# Patient Record
Sex: Male | Born: 1960
Health system: Southern US, Community
[De-identification: ages and names within clinical notes are randomized; demographics above are authoritative.]

## PROBLEM LIST (undated history)

## (undated) DIAGNOSIS — F32A Depression, unspecified: Secondary | ICD-10-CM

## (undated) DIAGNOSIS — I10 Essential (primary) hypertension: Secondary | ICD-10-CM

## (undated) HISTORY — PX: KNEE SURGERY: SHX244

---

## 1999-07-27 ENCOUNTER — Other Ambulatory Visit: Admission: RE | Admit: 1999-07-27 | Discharge: 1999-07-27 | Payer: Self-pay | Admitting: Urology

## 2014-09-15 ENCOUNTER — Ambulatory Visit: Payer: Self-pay | Admitting: *Deleted

## 2014-11-15 ENCOUNTER — Other Ambulatory Visit: Payer: Self-pay | Admitting: Nephrology

## 2014-11-15 DIAGNOSIS — N183 Chronic kidney disease, stage 3 unspecified: Secondary | ICD-10-CM

## 2014-11-16 ENCOUNTER — Ambulatory Visit
Admission: RE | Admit: 2014-11-16 | Discharge: 2014-11-16 | Disposition: A | Discharge status: Home / Self Care | Payer: Medicare HMO | Source: Ambulatory Visit | Attending: Nephrology | Admitting: Nephrology

## 2014-11-16 DIAGNOSIS — N183 Chronic kidney disease, stage 3 unspecified: Secondary | ICD-10-CM

## 2016-06-18 ENCOUNTER — Other Ambulatory Visit: Payer: Self-pay | Admitting: Family Medicine

## 2016-06-18 ENCOUNTER — Ambulatory Visit
Admission: RE | Admit: 2016-06-18 | Discharge: 2016-06-18 | Disposition: A | Discharge status: Home / Self Care | Payer: 59 | Source: Ambulatory Visit | Attending: Family Medicine | Admitting: Family Medicine

## 2016-06-18 DIAGNOSIS — M25461 Effusion, right knee: Secondary | ICD-10-CM

## 2016-06-18 DIAGNOSIS — M25561 Pain in right knee: Secondary | ICD-10-CM

## 2016-11-19 DIAGNOSIS — J029 Acute pharyngitis, unspecified: Secondary | ICD-10-CM | POA: Diagnosis not present

## 2016-11-19 DIAGNOSIS — R6889 Other general symptoms and signs: Secondary | ICD-10-CM | POA: Diagnosis not present

## 2018-03-17 ENCOUNTER — Encounter: Payer: Self-pay | Admitting: Family Medicine

## 2018-03-17 ENCOUNTER — Ambulatory Visit (INDEPENDENT_AMBULATORY_CARE_PROVIDER_SITE_OTHER): Payer: BLUE CROSS/BLUE SHIELD | Admitting: Family Medicine

## 2018-03-17 VITALS — BP 138/92 | Ht 71.0 in | Wt 211.0 lb

## 2018-03-17 DIAGNOSIS — M25562 Pain in left knee: Secondary | ICD-10-CM | POA: Diagnosis not present

## 2018-03-17 DIAGNOSIS — F339 Major depressive disorder, recurrent, unspecified: Secondary | ICD-10-CM

## 2018-03-17 DIAGNOSIS — F411 Generalized anxiety disorder: Secondary | ICD-10-CM | POA: Insufficient documentation

## 2018-03-17 DIAGNOSIS — S8992XA Unspecified injury of left lower leg, initial encounter: Secondary | ICD-10-CM

## 2018-03-17 MED ORDER — BUPROPION HCL ER (XL) 300 MG PO TB24: 300.0000 mg | ORAL_TABLET | Freq: Every day | ORAL | 11 refills | Status: DC

## 2018-03-17 NOTE — Progress Notes (Signed)
   Subjective:    Patient ID: Stephen [name], male    DOB: Aug 05, 1961, 57 y.o.   MRN: [id number]  HPI Patient is here today as a New patient. He is also here today with left knee pain.He states he injured it while unloading baggage he turned and it popped. Has not been too painful, not swelling,but feels something is moving around in there.  With turning and sudden popping sensation   Feels difficulty with extension   has some limited range of motion     Little achiness at night time  A sense of difficulty with extension   Things get kind of locked up   No prior difficulty with knee in the pst   Pos hx of right knee acl teear also substantial discussion  Held with patient about chronic depression with element of anxiety.  Has had this for many years.  States he definitely benefits from medication in an ongoing fashion.  He does have substantial challenges with anxiety and depression.  No suicidal thoughts.  Years ago he did have transient suicidal thoughts on going through divorce.  States overall the medication has done well form and would like to maintain   Review of Systems No headache, no major weight loss or weight gain, no chest pain no back pain abdominal pain no change in bowel habits complete ROS otherwise negative     Objective:   Physical Exam  Alert and oriented, vitals reviewed and stable, NAD ENT-TM's and ext canals WNL bilat via otoscopic exam Soft palate, tonsils and post pharynx WNL via oropharyngeal exam Neck-symmetric, no masses; thyroid nonpalpable and nontender Pulmonary-no tachypnea or accessory muscle use; Clear without wheezes via auscultation Card--no abnrml murmurs, rhythm reg and rate WNL Carotid pulses symmetric, without bruits Left knee negative joint line tenderness negative ACL laxity good range of motion currently.  Some diffuse pain posteriorly mild swelling posteriorly     Assessment & Plan:  Impression #1 left knee injury with  intermittent locking.  Discussed.  Likely represents cartilage injury.  Patient would prefer conservative approach at this time which I agree with.  Anti-inflammatory medicine plus time may have a mild cartilage injury which will resolve itself in terms of swelling and dysfunction.  Currently locking.  Locking persist several weeks from now return or call us and we will help set up with orthopedic  2.  Chronic depression/anxiety with ongoing benefit from meds discussed will maintain meds  3.  Encourage patient get on with wellness on into the summer  Old records

## 2018-03-23 ENCOUNTER — Other Ambulatory Visit: Payer: Self-pay

## 2018-04-01 DIAGNOSIS — L578 Other skin changes due to chronic exposure to nonionizing radiation: Secondary | ICD-10-CM | POA: Diagnosis not present

## 2018-04-01 DIAGNOSIS — L821 Other seborrheic keratosis: Secondary | ICD-10-CM | POA: Diagnosis not present

## 2018-04-01 DIAGNOSIS — L570 Actinic keratosis: Secondary | ICD-10-CM | POA: Diagnosis not present

## 2018-04-01 DIAGNOSIS — L820 Inflamed seborrheic keratosis: Secondary | ICD-10-CM | POA: Diagnosis not present

## 2018-07-22 DIAGNOSIS — G4733 Obstructive sleep apnea (adult) (pediatric): Secondary | ICD-10-CM | POA: Diagnosis not present

## 2018-08-10 ENCOUNTER — Ambulatory Visit (INDEPENDENT_AMBULATORY_CARE_PROVIDER_SITE_OTHER): Payer: BLUE CROSS/BLUE SHIELD | Admitting: Family Medicine

## 2018-08-10 ENCOUNTER — Encounter: Payer: Self-pay | Admitting: Family Medicine

## 2018-08-10 VITALS — BP 128/78 | Ht 71.0 in | Wt 215.0 lb

## 2018-08-10 DIAGNOSIS — F411 Generalized anxiety disorder: Secondary | ICD-10-CM

## 2018-08-10 DIAGNOSIS — Z0000 Encounter for general adult medical examination without abnormal findings: Secondary | ICD-10-CM

## 2018-08-10 DIAGNOSIS — Z131 Encounter for screening for diabetes mellitus: Secondary | ICD-10-CM

## 2018-08-10 DIAGNOSIS — Z13220 Encounter for screening for lipoid disorders: Secondary | ICD-10-CM

## 2018-08-10 DIAGNOSIS — F339 Major depressive disorder, recurrent, unspecified: Secondary | ICD-10-CM

## 2018-08-10 DIAGNOSIS — Z125 Encounter for screening for malignant neoplasm of prostate: Secondary | ICD-10-CM

## 2018-08-10 NOTE — Progress Notes (Signed)
   Subjective:    Patient ID: Stephen [name], male    DOB: 1961/08/01, 57 y.o.   MRN: [id number]  HPI  The patient comes in today for a wellness visit.    A review of their health history was completed.  A review of medications was also completed.  Any needed refills; none  Eating habits: trying to eat healthy  Falls/  MVA accidents in past few months: none  Regular exercise: do some  Specialist pt sees on regular basis: no  Preventative health issues were discussed.   Additional concerns: none  hx of knee challenges,  Overall left knee has calmed doewn    Gets irritable at times   Tolerating the well butrin well,  Feels does not need counselling at this time, working on self care issues  Uses levitra prn, not always   Pt takes tumeric        Review of Systems  Constitutional: Negative for activity change, appetite change and fever.  HENT: Negative for congestion and rhinorrhea.   Eyes: Negative for discharge.  Respiratory: Negative for cough and wheezing.   Cardiovascular: Negative for chest pain.  Gastrointestinal: Negative for abdominal pain, blood in stool and vomiting.  Genitourinary: Negative for difficulty urinating and frequency.  Musculoskeletal: Negative for neck pain.  Skin: Negative for rash.  Allergic/Immunologic: Negative for environmental allergies and food allergies.  Neurological: Negative for weakness and headaches.  Psychiatric/Behavioral: Negative for agitation.  All other systems reviewed and are negative.      Objective:   Physical Exam  Constitutional: He appears well-developed and well-nourished.  HENT:  Head: Normocephalic and atraumatic.  Right Ear: External ear normal.  Left Ear: External ear normal.  Nose: Nose normal.  Mouth/Throat: Oropharynx is clear and moist.  Eyes: Pupils are equal, round, and reactive to light. EOM are normal.  Neck: Normal range of motion. Neck supple. No thyromegaly present.    Cardiovascular: Normal rate, regular rhythm and normal heart sounds.  No murmur heard. Pulmonary/Chest: Effort normal and breath sounds normal. No respiratory distress. He has no wheezes.  Abdominal: Soft. Bowel sounds are normal. He exhibits no distension and no mass. There is no tenderness.  Genitourinary: Prostate normal and penis normal.  Musculoskeletal: Normal range of motion. He exhibits no edema.  Lymphadenopathy:    He has no cervical adenopathy.  Neurological: He is alert. He exhibits normal muscle tone.  Skin: Skin is warm and dry. No erythema.  Psychiatric: He has a normal mood and affect. His behavior is normal. Judgment normal.  Vitals reviewed.         Assessment & Plan:  Impression wellness exam.  Diet discussed.  Exercise discussed.  Vaccines discussed and flu shot due November 2020 due in fiv due to polyps , usually does not take flu, but go tone this yr, considering getting exercise equip, but not overdoing it ,  #2 generalized anxiety disorder with element of depression overall medication is controlling symptoms relatively well.  Discussed.  Patient thinks it definitely benefit from medicine so will maintain

## 2018-08-14 DIAGNOSIS — Z131 Encounter for screening for diabetes mellitus: Secondary | ICD-10-CM | POA: Diagnosis not present

## 2018-08-14 DIAGNOSIS — Z13220 Encounter for screening for lipoid disorders: Secondary | ICD-10-CM | POA: Diagnosis not present

## 2018-08-14 DIAGNOSIS — Z125 Encounter for screening for malignant neoplasm of prostate: Secondary | ICD-10-CM | POA: Diagnosis not present

## 2018-08-15 LAB — BASIC METABOLIC PANEL
BUN/Creatinine Ratio: 14 (ref 9–20)
BUN: 19 mg/dL (ref 6–24)
CO2: 20 mmol/L (ref 20–29)
Calcium: 9.6 mg/dL (ref 8.7–10.2)
Chloride: 105 mmol/L (ref 96–106)
Creatinine, Ser: 1.37 mg/dL — ABNORMAL HIGH (ref 0.76–1.27)
GFR calc Af Amer: 66 mL/min/{1.73_m2} (ref >59)
GFR calc non Af Amer: 57 mL/min/{1.73_m2} — ABNORMAL LOW (ref >59)
Glucose: 94 mg/dL (ref 65–99)
Potassium: 4.5 mmol/L (ref 3.5–5.2)
Sodium: 143 mmol/L (ref 134–144)

## 2018-08-15 LAB — LIPID PANEL
Chol/HDL Ratio: 4.4 ratio (ref 0.0–5.0)
Cholesterol, Total: 168 mg/dL (ref 100–199)
HDL: 38 mg/dL — ABNORMAL LOW (ref >39)
LDL Calculated: 85 mg/dL (ref 0–99)
Triglycerides: 224 mg/dL — ABNORMAL HIGH (ref 0–149)
VLDL Cholesterol Cal: 45 mg/dL — ABNORMAL HIGH (ref 5–40)

## 2018-08-15 LAB — HEPATIC FUNCTION PANEL
ALT: 34 IU/L (ref 0–44)
AST: 24 IU/L (ref 0–40)
Albumin: 4.6 g/dL (ref 3.5–5.5)
Alkaline Phosphatase: 72 IU/L (ref 39–117)
Bilirubin Total: <0.2 mg/dL (ref 0.0–1.2)
Bilirubin, Direct: 0.08 mg/dL (ref 0.00–0.40)
Total Protein: 7 g/dL (ref 6.0–8.5)

## 2018-08-15 LAB — PSA: Prostate Specific Ag, Serum: 0.8 ng/mL (ref 0.0–4.0)

## 2018-08-16 ENCOUNTER — Encounter: Payer: Self-pay | Admitting: Family Medicine

## 2019-01-19 ENCOUNTER — Other Ambulatory Visit: Payer: Self-pay | Admitting: Family Medicine

## 2019-02-12 ENCOUNTER — Ambulatory Visit: Payer: BLUE CROSS/BLUE SHIELD | Admitting: Family Medicine

## 2019-03-05 ENCOUNTER — Telehealth: Payer: Self-pay | Admitting: Family Medicine

## 2019-03-05 NOTE — Telephone Encounter (Signed)
Fax from [email or web address] requesting refill on Levitra 20 mg. Left message on patient voicemail to verify that he is requesting a refill though [facility].

## 2019-03-12 DIAGNOSIS — G4733 Obstructive sleep apnea (adult) (pediatric): Secondary | ICD-10-CM | POA: Diagnosis not present

## 2019-03-25 NOTE — Telephone Encounter (Signed)
Pt never returned call. Message closed.

## 2019-04-22 ENCOUNTER — Other Ambulatory Visit: Payer: Self-pay

## 2019-04-22 ENCOUNTER — Other Ambulatory Visit: Payer: Self-pay | Admitting: *Deleted

## 2019-04-22 ENCOUNTER — Ambulatory Visit (INDEPENDENT_AMBULATORY_CARE_PROVIDER_SITE_OTHER): Payer: BC Managed Care – PPO | Admitting: Family Medicine

## 2019-04-22 ENCOUNTER — Telehealth: Payer: Self-pay | Admitting: *Deleted

## 2019-04-22 DIAGNOSIS — S30861A Insect bite (nonvenomous) of abdominal wall, initial encounter: Secondary | ICD-10-CM

## 2019-04-22 DIAGNOSIS — R21 Rash and other nonspecific skin eruption: Secondary | ICD-10-CM | POA: Diagnosis not present

## 2019-04-22 DIAGNOSIS — W57XXXA Bitten or stung by nonvenomous insect and other nonvenomous arthropods, initial encounter: Secondary | ICD-10-CM

## 2019-04-22 DIAGNOSIS — R5383 Other fatigue: Secondary | ICD-10-CM | POA: Diagnosis not present

## 2019-04-22 MED ORDER — DOXYCYCLINE HYCLATE 100 MG PO TABS: 100.0000 mg | ORAL_TABLET | Freq: Two times a day (BID) | ORAL | 0 refills | Status: DC

## 2019-04-22 MED ORDER — VARDENAFIL HCL 20 MG PO TABS: 20.0000 mg | ORAL_TABLET | Freq: Every day | ORAL | 11 refills | Status: DC | PRN

## 2019-04-22 NOTE — Telephone Encounter (Signed)
Wellness on 08/10/18. Fax from [facility] requesting refill on levitra 20mg  #24.    Fax number [phone]

## 2019-04-22 NOTE — Progress Notes (Signed)
   Subjective:    Patient ID: Stephen [name], male    DOB: 11-26-1960, 58 y.o.   MRN: [id number]  HPI Good discussion with the patient He is having significant troubles with a rash around his wrist along with a slight headache and some fatigue denies high fever chills sweats he states he had a tick bite approximately a month ago he is worried about the possibility of Lyme's disease  Patient calls and states he had a headache yesterday. Patient also states he had an itchy rash on his wrist. Patient states the rash and headache is gone today but he was concerned because he had a tick bite 1 month ago  Virtual Visit via Video Note  I connected with Gregorey Nabor Farabaugh on 04/22/19 at  3:50 PM EDT by a video enabled telemedicine application and verified that I am speaking with the correct person using two identifiers.  Location: Patient: home Provider: office   I discussed the limitations of evaluation and management by telemedicine and the availability of in person appointments. The patient expressed understanding and agreed to proceed.  History of Present Illness:    Observations/Objective:   Assessment and Plan:   Follow Up Instructions:    I discussed the assessment and treatment plan with the patient. The patient was provided an opportunity to ask questions and all were answered. The patient agreed with the plan and demonstrated an understanding of the instructions.   The patient was advised to call back or seek an in-person evaluation if the symptoms worsen or if the condition fails to improve as anticipated.  I provided 15 minutes of non-face-to-face time during this encounter.      Review of Systems  Constitutional: Negative for activity change, appetite change and fatigue.  HENT: Negative for congestion and rhinorrhea.   Respiratory: Negative for cough and shortness of breath.   Cardiovascular: Negative for chest pain and leg swelling.  Gastrointestinal:  Negative for abdominal pain, nausea and vomiting.  Neurological: Negative for dizziness and headaches.  Psychiatric/Behavioral: Negative for agitation and behavioral problems.       Objective:   Physical Exam  Patient had virtual visit Appears to be in no distress Atraumatic Neuro able to relate and oriented No apparent resp distress Color normal         Assessment & Plan:  Significant rash with slight fatigue and slight headache no high fevers low likelihood of Lyme's disease we did discuss various options including lab work versus doxycycline versus watchful waiting After significant discussion patient chooses to do doxycycline twice daily for the next 2 weeks to follow-up if progressive troubles or if worse warning signs discussed in detail  I did talk with him should he start having body aches headache fever chills sweats or other symptoms worrisome for other illness or tick related illness today he should let us know right away and we will be happy to recheck

## 2019-04-22 NOTE — Telephone Encounter (Signed)
Script printed and at nurse station. Waiting for signature then will fax and call pt.

## 2019-04-22 NOTE — Telephone Encounter (Signed)
okrefill times 11

## 2019-04-23 NOTE — Telephone Encounter (Signed)
Script faxed and pt is aware.  

## 2019-05-14 ENCOUNTER — Other Ambulatory Visit: Payer: Self-pay

## 2019-05-14 ENCOUNTER — Other Ambulatory Visit: Payer: 59

## 2019-05-14 DIAGNOSIS — Z20822 Contact with and (suspected) exposure to covid-19: Secondary | ICD-10-CM

## 2019-05-14 DIAGNOSIS — R6889 Other general symptoms and signs: Secondary | ICD-10-CM | POA: Diagnosis not present

## 2019-05-19 ENCOUNTER — Other Ambulatory Visit: Payer: Self-pay

## 2019-05-19 ENCOUNTER — Other Ambulatory Visit: Payer: Self-pay | Admitting: Family Medicine

## 2019-05-19 MED ORDER — BUPROPION HCL ER (XL) 300 MG PO TB24: ORAL_TABLET | ORAL | 0 refills | Status: DC

## 2019-05-19 NOTE — Telephone Encounter (Signed)
Go ahead and give three mo worth on this chronicaLLY

## 2019-05-20 LAB — NOVEL CORONAVIRUS, NAA: SARS-CoV-2, NAA: NOT DETECTED

## 2019-06-03 DIAGNOSIS — L814 Other melanin hyperpigmentation: Secondary | ICD-10-CM | POA: Diagnosis not present

## 2019-06-03 DIAGNOSIS — L820 Inflamed seborrheic keratosis: Secondary | ICD-10-CM | POA: Diagnosis not present

## 2019-06-03 DIAGNOSIS — L821 Other seborrheic keratosis: Secondary | ICD-10-CM | POA: Diagnosis not present

## 2019-06-03 DIAGNOSIS — D229 Melanocytic nevi, unspecified: Secondary | ICD-10-CM | POA: Diagnosis not present

## 2019-08-31 ENCOUNTER — Ambulatory Visit (INDEPENDENT_AMBULATORY_CARE_PROVIDER_SITE_OTHER): Payer: BC Managed Care – PPO | Admitting: Family Medicine

## 2019-08-31 DIAGNOSIS — Z13220 Encounter for screening for lipoid disorders: Secondary | ICD-10-CM

## 2019-08-31 DIAGNOSIS — Z79899 Other long term (current) drug therapy: Secondary | ICD-10-CM

## 2019-08-31 DIAGNOSIS — Z125 Encounter for screening for malignant neoplasm of prostate: Secondary | ICD-10-CM | POA: Diagnosis not present

## 2019-08-31 DIAGNOSIS — M79642 Pain in left hand: Secondary | ICD-10-CM

## 2019-08-31 DIAGNOSIS — R5383 Other fatigue: Secondary | ICD-10-CM

## 2019-08-31 NOTE — Progress Notes (Signed)
   Subjective:  Audio plus video  Patient ID: Stephen [name], male    DOB: 06/21/1961, 58 y.o.   MRN: [id number]  Hand Pain  Associated symptoms comments: Swelling on left knuckle, middle finger. Sore in the mornings. Pt states this has been going on for about 6 months. No loss of feeling. Pt states once he gets it worked out, his hand is fine. . He has tried nothing for the symptoms.   Virtual Visit via Video Note  I connected with Stephen [name] on 08/31/19 at  9:00 AM EDT by a video enabled telemedicine application and verified that I am speaking with the correct person using two identifiers.  Location: Patient: home Provider: office   I discussed the limitations of evaluation and management by telemedicine and the availability of in person appointments. The patient expressed understanding and agreed to proceed.  History of Present Illness:    Observations/Objective:   Assessment and Plan:   Follow Up Instructions:    I discussed the assessment and treatment plan with the patient. The patient was provided an opportunity to ask questions and all were answered. The patient agreed with the plan and demonstrated an understanding of the instructions.   The patient was advised to call back or seek an in-person evaluation if the symptoms worsen or if the condition fails to improve as anticipated.  I provided 18 minutes of non-face-to-face time during this encounter.   Vicente Males, LPN  Patient notes significant family history of rheumatoid arthritis which understandably elevates his concern  His had his metacarpal phalangeal joint middle finger of his hand develop swelling pain tenderness and discomfort progressive over recent months.  Recalls no injury.  Uses as needed medication  Review of Systems No headache no chest pain no cough    Objective:   Physical Exam   Virtual when elevating hands the camera there is distinct swelling at the metacarpal phalangeal  junction    Assessment & Plan:  Impression and swelling focal with some discomfort and pain strong family history of rheumatoid arthritis.  Discussed.  We will press on with x-ray plus sedimentation rate and rheumatoid factor.  We will also add yearly screening blood work rationale discussed

## 2019-09-01 ENCOUNTER — Encounter: Payer: Self-pay | Admitting: Family Medicine

## 2019-09-02 DIAGNOSIS — Z13220 Encounter for screening for lipoid disorders: Secondary | ICD-10-CM | POA: Diagnosis not present

## 2019-09-02 DIAGNOSIS — Z125 Encounter for screening for malignant neoplasm of prostate: Secondary | ICD-10-CM | POA: Diagnosis not present

## 2019-09-02 DIAGNOSIS — M79642 Pain in left hand: Secondary | ICD-10-CM | POA: Diagnosis not present

## 2019-09-02 DIAGNOSIS — Z79899 Other long term (current) drug therapy: Secondary | ICD-10-CM | POA: Diagnosis not present

## 2019-09-03 LAB — BASIC METABOLIC PANEL
BUN/Creatinine Ratio: 9 (ref 9–20)
BUN: 14 mg/dL (ref 6–24)
CO2: 24 mmol/L (ref 20–29)
Calcium: 9.7 mg/dL (ref 8.7–10.2)
Chloride: 105 mmol/L (ref 96–106)
Creatinine, Ser: 1.5 mg/dL — ABNORMAL HIGH (ref 0.76–1.27)
GFR calc Af Amer: 58 mL/min/{1.73_m2} — ABNORMAL LOW (ref >59)
GFR calc non Af Amer: 51 mL/min/{1.73_m2} — ABNORMAL LOW (ref >59)
Glucose: 88 mg/dL (ref 65–99)
Potassium: 4.6 mmol/L (ref 3.5–5.2)
Sodium: 141 mmol/L (ref 134–144)

## 2019-09-03 LAB — LIPID PANEL
Chol/HDL Ratio: 3.5 ratio (ref 0.0–5.0)
Cholesterol, Total: 166 mg/dL (ref 100–199)
HDL: 47 mg/dL (ref >39)
LDL Chol Calc (NIH): 101 mg/dL — ABNORMAL HIGH (ref 0–99)
Triglycerides: 98 mg/dL (ref 0–149)
VLDL Cholesterol Cal: 18 mg/dL (ref 5–40)

## 2019-09-03 LAB — HEPATIC FUNCTION PANEL
ALT: 24 IU/L (ref 0–44)
AST: 20 IU/L (ref 0–40)
Albumin: 4.6 g/dL (ref 3.8–4.9)
Alkaline Phosphatase: 72 IU/L (ref 39–117)
Bilirubin Total: 0.4 mg/dL (ref 0.0–1.2)
Bilirubin, Direct: 0.14 mg/dL (ref 0.00–0.40)
Total Protein: 7.3 g/dL (ref 6.0–8.5)

## 2019-09-03 LAB — PSA: Prostate Specific Ag, Serum: 0.7 ng/mL (ref 0.0–4.0)

## 2019-09-03 LAB — RHEUMATOID FACTOR: Rheumatoid fact SerPl-aCnc: <10 IU/mL (ref 0.0–13.9)

## 2019-09-03 LAB — SEDIMENTATION RATE: Sed Rate: 8 mm/hr (ref 0–30)

## 2019-09-05 ENCOUNTER — Encounter: Payer: Self-pay | Admitting: Family Medicine

## 2019-09-08 ENCOUNTER — Ambulatory Visit (HOSPITAL_COMMUNITY)
Admission: RE | Admit: 2019-09-08 | Discharge: 2019-09-08 | Disposition: A | Discharge status: Home / Self Care | Payer: BC Managed Care – PPO | Source: Ambulatory Visit | Attending: Family Medicine | Admitting: Family Medicine

## 2019-09-08 ENCOUNTER — Other Ambulatory Visit: Payer: Self-pay

## 2019-09-08 DIAGNOSIS — M7989 Other specified soft tissue disorders: Secondary | ICD-10-CM | POA: Diagnosis not present

## 2019-09-08 DIAGNOSIS — M79642 Pain in left hand: Secondary | ICD-10-CM | POA: Diagnosis not present

## 2019-09-14 ENCOUNTER — Other Ambulatory Visit: Payer: Self-pay

## 2019-09-14 ENCOUNTER — Encounter: Payer: Self-pay | Admitting: Family Medicine

## 2019-09-14 ENCOUNTER — Ambulatory Visit (INDEPENDENT_AMBULATORY_CARE_PROVIDER_SITE_OTHER): Payer: BC Managed Care – PPO | Admitting: Family Medicine

## 2019-09-14 VITALS — BP 126/86 | Temp 97.2°F | Ht 71.0 in | Wt 210.6 lb

## 2019-09-14 DIAGNOSIS — Z23 Encounter for immunization: Secondary | ICD-10-CM

## 2019-09-14 DIAGNOSIS — N1831 Chronic kidney disease, stage 3a: Secondary | ICD-10-CM

## 2019-09-14 NOTE — Progress Notes (Signed)
Subjective:    Patient ID: Stephen [name], male    DOB: [birth year], 58 y.o.   MRN: [id number]  HPI  Patient arrives to discuss results of recent lab work.  Results for orders placed or performed in visit on 08/31/19  Sed Rate (ESR)  Result Value Ref Range   Sed Rate 8 0 - 30 mm/hr  Rheumatoid Factor  Result Value Ref Range   Rhuematoid fact SerPl-aCnc <10.0 0.0 - 13.9 IU/mL  Lipid Profile  Result Value Ref Range   Cholesterol, Total 166 100 - 199 mg/dL   Triglycerides 98 0 - 149 mg/dL   HDL 47 >39 mg/dL   VLDL Cholesterol Cal 18 5 - 40 mg/dL   LDL Chol Calc (NIH) 101 (H) 0 - 99 mg/dL   Chol/HDL Ratio 3.5 0.0 - 5.0 ratio  Hepatic function panel  Result Value Ref Range   Total Protein 7.3 6.0 - 8.5 g/dL   Albumin 4.6 3.8 - 4.9 g/dL   Bilirubin Total 0.4 0.0 - 1.2 mg/dL   Bilirubin, Direct 0.14 0.00 - 0.40 mg/dL   Alkaline Phosphatase 72 39 - 117 IU/L   AST 20 0 - 40 IU/L   ALT 24 0 - 44 IU/L  Basic Metabolic Panel (BMET)  Result Value Ref Range   Glucose 88 65 - 99 mg/dL   BUN 14 6 - 24 mg/dL   Creatinine, Ser 1.50 (H) 0.76 - 1.27 mg/dL   GFR calc non Af Amer 51 (L) >59 mL/min/1.73   GFR calc Af Amer 58 (L) >59 mL/min/1.73   BUN/Creatinine Ratio 9 9 - 20   Sodium 141 134 - 144 mmol/L   Potassium 4.6 3.5 - 5.2 mmol/L   Chloride 105 96 - 106 mmol/L   CO2 24 20 - 29 mmol/L   Calcium 9.7 8.7 - 10.2 mg/dL  PSA  Result Value Ref Range   Prostate Specific Ag, Serum 0.7 0.0 - 4.0 ng/mL   Transient acrid smell very couple days, hits temporarily, coes and goes  Off and on for 6 mo. Lasts min or two   Patient notes persistent and discomfort.  Primarily third metacarpal phalangeal joint.  Some swelling.  Patient was anxious due to family history of rheumatoid arthritis  Patient had blood work revealing creatinine 1.5 and GFR in the low 50s.  Review of old blood work from prior primary care doctor at [facility] showed that that GFR was similar for years ago.  Creatinine at  that time 1 threes.  Patient notes his creatinine has always been a bit elevated compared to normal.  There is some family history of renal failure right at the end of life with congestive heart failure.  Patient does not smoke.  No issues with blood pressure cholesterol or sugar.  Does not eat excessive amounts of  Review of Systems No headache, no major weight loss or weight gain, no chest pain no back pain abdominal pain no change in bowel habits complete ROS otherwise negative     Objective:   Physical Exam  Alert and oriented, vitals reviewed and stable, NAD ENT-TM's and ext canals WNL bilat via otoscopic exam Soft palate, tonsils and post pharynx WNL via oropharyngeal exam Neck-symmetric, no masses; thyroid nonpalpable and nontender Pulmonary-no tachypnea or accessory muscle use; Clear without wheezes via auscultation Card--no abnrml murmurs, rhythm reg and rate WNL Carotid pulses symmetric, without bruits Left hand no visible swelling of joints of concern.  There is otherwise normal  X-ray reviewed with  patient  Blood work results reviewed with patient      Assessment & Plan:  Impression 1 focal hand swelling.  Negative x-ray negative rheumatoid factor normal sed rate chance of rheumatological process very very low discussed with patient  2.  Chronic renal insufficiency.  Mild.  But an issue for the patient.  Long discussion held regarding long-term methods of avoiding progression to more serious kidney disease.  Many questions answered  Greater than 50% of this 25 minute face to face visit was spent in counseling and discussion and coordination of care regarding the above diagnosis/diagnosies  Flu shot today

## 2019-09-25 DIAGNOSIS — G4733 Obstructive sleep apnea (adult) (pediatric): Secondary | ICD-10-CM | POA: Diagnosis not present

## 2019-10-17 ENCOUNTER — Other Ambulatory Visit: Payer: Self-pay

## 2019-10-17 ENCOUNTER — Ambulatory Visit
Admission: EM | Admit: 2019-10-17 | Discharge: 2019-10-17 | Disposition: A | Discharge status: Home / Self Care | Payer: BC Managed Care – PPO | Attending: Emergency Medicine | Admitting: Emergency Medicine

## 2019-10-17 DIAGNOSIS — Z20828 Contact with and (suspected) exposure to other viral communicable diseases: Secondary | ICD-10-CM

## 2019-10-17 DIAGNOSIS — Z20822 Contact with and (suspected) exposure to covid-19: Secondary | ICD-10-CM

## 2019-10-17 DIAGNOSIS — J069 Acute upper respiratory infection, unspecified: Secondary | ICD-10-CM

## 2019-10-17 MED ORDER — CETIRIZINE HCL 10 MG PO TABS: 10.0000 mg | ORAL_TABLET | Freq: Every day | ORAL | 0 refills | Status: DC

## 2019-10-17 MED ORDER — FLUTICASONE PROPIONATE 50 MCG/ACT NA SUSP: 2.0000 | Freq: Every day | NASAL | 0 refills | Status: DC

## 2019-10-17 MED ORDER — BENZONATATE 100 MG PO CAPS: 100.0000 mg | ORAL_CAPSULE | Freq: Three times a day (TID) | ORAL | 0 refills | Status: DC

## 2019-10-17 NOTE — ED Triage Notes (Signed)
Pt presents to UC w/ co coughing, sore throat, mild headache since last night. Pt states he woke up coughing and persistent. Pt denies covid positive exposure.

## 2019-10-17 NOTE — Discharge Instructions (Signed)

## 2019-10-17 NOTE — ED Provider Notes (Signed)
Media   [id number] 10/17/19 Arrival Time: 0908   CC: COVID symptoms  SUBJECTIVE: History from: patient.  Stephen [name] is a 58 y.o. male who presents with mild productive cough, PND, and mild HA x 1 day.  Denies sick exposure to COVID, flu or strep.  Denies recent travel. States he used nasal spray prior to CPAP and speculates this may have contributed to his symptoms.   Has NOT tried OTC medications.  Symptoms are made worse with at night.  Reports previous symptoms in the past.   Denies fever, chills, fatigue, sinus pain, rhinorrhea, SOB, wheezing, chest pain, nausea, vomiting, changes in bowel or bladder habits.    ROS: As per HPI.  All other pertinent ROS negative.     History reviewed. No pertinent past medical history. History reviewed. No pertinent surgical history. No Known Allergies No current facility-administered medications on file prior to encounter.   Current Outpatient Medications on File Prior to Encounter  Medication Sig Dispense Refill  . buPROPion (WELLBUTRIN XL) 300 MG 24 hr tablet TAKE (1) TABLET BY MOUTH ONCE DAILY. 90 tablet 0  . Multiple Vitamin (MULTIVITAMIN) tablet Take 1 tablet by mouth daily.    Marland Kitchen OVER THE COUNTER MEDICATION Vit D once daily    . vardenafil (LEVITRA) 20 MG tablet Take 1 tablet (20 mg total) by mouth daily as needed for erectile dysfunction. 24 tablet 11   Social History   Socioeconomic History  . Marital status: Married    Spouse name: Not on file  . Number of children: Not on file  . Years of education: Not on file  . Highest education level: Not on file  Occupational History  . Not on file  Tobacco Use  . Smoking status: Never Smoker  . Smokeless tobacco: Never Used  Substance and Sexual Activity  . Alcohol use: Not on file  . Drug use: Not on file  . Sexual activity: Not on file  Other Topics Concern  . Not on file  Social History Narrative  . Not on file   Social Determinants of Health    Financial Resource Strain:   . Difficulty of Paying Living Expenses: Not on file  Food Insecurity:   . Worried About Charity fundraiser in the Last Year: Not on file  . Ran Out of Food in the Last Year: Not on file  Transportation Needs:   . Lack of Transportation (Medical): Not on file  . Lack of Transportation (Non-Medical): Not on file  Physical Activity:   . Days of Exercise per Week: Not on file  . Minutes of Exercise per Session: Not on file  Stress:   . Feeling of Stress : Not on file  Social Connections:   . Frequency of Communication with Friends and Family: Not on file  . Frequency of Social Gatherings with Friends and Family: Not on file  . Attends Religious Services: Not on file  . Active Member of Clubs or Organizations: Not on file  . Attends Archivist Meetings: Not on file  . Marital Status: Not on file  Intimate Partner Violence:   . Fear of Current or Ex-Partner: Not on file  . Emotionally Abused: Not on file  . Physically Abused: Not on file  . Sexually Abused: Not on file   Family History  Problem Relation Age of Onset  . Healthy Mother   . Healthy Father     OBJECTIVE:  Vitals:   10/17/19 0935  BP: Marland Kitchen)  143/82  Pulse: 79  Resp: 16  Temp: 98.5 F (36.9 C)  TempSrc: Oral  SpO2: 97%     General appearance: alert; well-appearing nontoxic; speaking in full sentences and tolerating own secretions HEENT: NCAT; Ears: EACs clear, TMs pearly gray; Eyes: PERRL.  EOM grossly intact.  Nose: nares patent without rhinorrhea, Throat: oropharynx clear, tonsils non erythematous or enlarged, uvula midline  Neck: supple without LAD Lungs: unlabored respirations, symmetrical air entry; cough: mild; no respiratory distress; CTAB Heart: regular rate and rhythm.  Skin: warm and dry Psychological: alert and cooperative; normal mood and affect  ASSESSMENT & PLAN:  1. Suspected COVID-19 virus infection   2. Viral URI with cough     Meds ordered this  encounter  Medications  . cetirizine (ZYRTEC) 10 MG tablet    Sig: Take 1 tablet (10 mg total) by mouth daily.    Dispense:  30 tablet    Refill:  0    Order Specific Question:   Supervising Provider    Answer:   Eustace Moore [[id number]]  . fluticasone (FLONASE) 50 MCG/ACT nasal spray    Sig: Place 2 sprays into both nostrils daily.    Dispense:  16 g    Refill:  0    Order Specific Question:   Supervising Provider    Answer:   Eustace Moore [[id number]]  . benzonatate (TESSALON) 100 MG capsule    Sig: Take 1 capsule (100 mg total) by mouth every 8 (eight) hours.    Dispense:  21 capsule    Refill:  0    Order Specific Question:   Supervising Provider    Answer:   Eustace Moore [[id number]]    COVID testing ordered.  It will take between 5-7 days for test results.  Someone will contact you regarding abnormal results.    In the meantime: You should remain isolated in your home for 10 days from symptom onset AND greater than 72 hours after symptoms resolution (absence of fever without the use of fever-reducing medication and improvement in respiratory symptoms), whichever is longer Get plenty of rest and push fluids Tessalon Perles prescribed for cough Use OTC zyrtec for nasal congestion, runny nose, and/or sore throat Use OTC flonase for nasal congestion and runny nose Use medications daily for symptom relief Use OTC medications like ibuprofen or tylenol as needed fever or pain Call or go to the ED if you have any new or worsening symptoms such as fever, worsening cough, shortness of breath, chest tightness, chest pain, turning blue, changes in mental status, etc...   Reviewed expectations re: course of current medical issues. Questions answered. Outlined signs and symptoms indicating need for more acute intervention. Patient verbalized understanding. After Visit Summary given.         Rennis Harding, PA-C 10/17/19 1005

## 2019-10-20 LAB — NOVEL CORONAVIRUS, NAA: SARS-CoV-2, NAA: NOT DETECTED

## 2019-10-25 ENCOUNTER — Other Ambulatory Visit: Payer: Self-pay | Admitting: Family Medicine

## 2019-11-03 ENCOUNTER — Other Ambulatory Visit: Payer: Self-pay

## 2019-11-03 ENCOUNTER — Ambulatory Visit (INDEPENDENT_AMBULATORY_CARE_PROVIDER_SITE_OTHER): Payer: BC Managed Care – PPO | Admitting: Family Medicine

## 2019-11-03 ENCOUNTER — Encounter: Payer: Self-pay | Admitting: Family Medicine

## 2019-11-03 DIAGNOSIS — Z20822 Contact with and (suspected) exposure to covid-19: Secondary | ICD-10-CM

## 2019-11-03 DIAGNOSIS — Z20828 Contact with and (suspected) exposure to other viral communicable diseases: Secondary | ICD-10-CM | POA: Diagnosis not present

## 2019-11-03 DIAGNOSIS — F339 Major depressive disorder, recurrent, unspecified: Secondary | ICD-10-CM

## 2019-11-03 NOTE — Telephone Encounter (Signed)
Stephen Kirschner, MD  You 3 minutes ago (9:07 AM)   Rec o v this eve, virt, let pt know I think he is misdiagnosing, we can rule in or rule out thyr disease with a blood tes but his constellation of symptoms are much more consistent with covid 19 than thyr (with a false neg in wife quite likely)

## 2019-11-03 NOTE — Progress Notes (Signed)
   Subjective:  Audio plus video  Patient ID: Stephen [name], male    DOB: [birth year], 59 y.o.   MRN: [id number]  HPI Pt was sick over the weekend. Saturday and Sunday pt throat was hurting, headache, body ached, lethargic feeling. Pt had some cough and runny nose on Saturday. 2 weeks ago went to Urgent Care and test was negative for COVID. Pt has been taking zinc, vitamin and multivitamins. Pt states his throat is hurting bad, but thyroid gland hurts worse. Pt states that thyroid gland is sore to the touch. Does not hurt that bad to swallow. Pt states his long distance vision is also blurry. Pt states he was having ear pain over the weekend also. Wife was sick over weekend also.   Virtual Visit via Video Note  I connected with Stephen [name] on 11/03/19 at  4:10 PM EST by a video enabled telemedicine application and verified that I am speaking with the correct person using two identifiers.  Location: Patient: home Provider: office    I discussed the limitations of evaluation and management by telemedicine and the availability of in person appointments. The patient expressed understanding and agreed to proceed.  History of Present Illness:    Observations/Objective:   Assessment and Plan:   Follow Up Instructions:    I discussed the assessment and treatment plan with the patient. The patient was provided an opportunity to ask questions and all were answered. The patient agreed with the plan and demonstrated an understanding of the instructions.   The patient was advised to call back or seek an in-person evaluation if the symptoms worsen or if the condition fails to improve as anticipated.  I provided 17 minutes of non-face-to-face time during this encounter.   Vicente Males, LPN   Review of Systems No shortness of breath no rash no high fevers    Objective:   Physical Exam Virtual Republican         Assessment & Plan:  Impression viral syndrome.  COVID-19  a substantial consideration.  Rationale discussed.  Testing recommended symptom care discussed

## 2019-11-03 NOTE — Telephone Encounter (Signed)
Patient scheduled virtual visit today with Dr Steve 

## 2019-11-04 ENCOUNTER — Other Ambulatory Visit: Payer: Self-pay

## 2019-11-04 ENCOUNTER — Ambulatory Visit: Payer: 59 | Attending: Internal Medicine

## 2019-11-04 DIAGNOSIS — Z20828 Contact with and (suspected) exposure to other viral communicable diseases: Secondary | ICD-10-CM | POA: Diagnosis not present

## 2019-11-04 DIAGNOSIS — Z20822 Contact with and (suspected) exposure to covid-19: Secondary | ICD-10-CM

## 2019-11-05 LAB — NOVEL CORONAVIRUS, NAA: SARS-CoV-2, NAA: NOT DETECTED

## 2019-12-08 ENCOUNTER — Encounter: Payer: Self-pay | Admitting: Family Medicine

## 2020-01-11 DIAGNOSIS — G4733 Obstructive sleep apnea (adult) (pediatric): Secondary | ICD-10-CM | POA: Diagnosis not present

## 2020-01-26 ENCOUNTER — Other Ambulatory Visit: Payer: Self-pay | Admitting: Family Medicine

## 2020-01-29 ENCOUNTER — Encounter: Payer: Self-pay | Admitting: Family Medicine

## 2020-02-21 ENCOUNTER — Encounter: Payer: Self-pay | Admitting: Family Medicine

## 2020-02-21 ENCOUNTER — Other Ambulatory Visit: Payer: Self-pay | Admitting: *Deleted

## 2020-02-22 NOTE — Progress Notes (Signed)
My chart message sent to patient to schedule visit.

## 2020-02-22 NOTE — Progress Notes (Signed)
I can see, but not next week and not today

## 2020-03-21 ENCOUNTER — Encounter: Payer: Self-pay | Admitting: Family Medicine

## 2020-03-23 ENCOUNTER — Encounter: Payer: Self-pay | Admitting: Family Medicine

## 2020-03-23 ENCOUNTER — Ambulatory Visit (INDEPENDENT_AMBULATORY_CARE_PROVIDER_SITE_OTHER): Payer: BC Managed Care – PPO | Admitting: Family Medicine

## 2020-03-23 ENCOUNTER — Other Ambulatory Visit: Payer: Self-pay

## 2020-03-23 VITALS — BP 130/86 | HR 82 | Temp 97.2°F | Wt 200.4 lb

## 2020-03-23 DIAGNOSIS — G8929 Other chronic pain: Secondary | ICD-10-CM

## 2020-03-23 DIAGNOSIS — M25511 Pain in right shoulder: Secondary | ICD-10-CM | POA: Diagnosis not present

## 2020-03-23 MED ORDER — DICLOFENAC SODIUM 1 % EX GEL: 2.0000 g | Freq: Four times a day (QID) | CUTANEOUS | 1 refills | Status: DC

## 2020-03-23 NOTE — Patient Instructions (Signed)
Shoulder Exercises Ask your health care provider which exercises are safe for you. Do exercises exactly as told by your health care provider and adjust them as directed. It is normal to feel mild stretching, pulling, tightness, or discomfort as you do these exercises. Stop right away if you feel sudden pain or your pain gets worse. Do not begin these exercises until told by your health care provider. Stretching exercises External rotation and abduction This exercise is sometimes called corner stretch. This exercise rotates your arm outward (external rotation) and moves your arm out from your body (abduction). 1. Stand in a doorway with one of your feet slightly in front of the other. This is called a staggered stance. If you cannot reach your forearms to the door frame, stand facing a corner of a room. 2. Choose one of the following positions as told by your health care provider: ? Place your hands and forearms on the door frame above your head. ? Place your hands and forearms on the door frame at the height of your head. ? Place your hands on the door frame at the height of your elbows. 3. Slowly move your weight onto your front foot until you feel a stretch across your chest and in the front of your shoulders. Keep your head and chest upright and keep your abdominal muscles tight. 4. Hold for __________ seconds. 5. To release the stretch, shift your weight to your back foot. Repeat __________ times. Complete this exercise __________ times a day. Extension, standing 1. Stand and hold a broomstick, a cane, or a similar object behind your back. ? Your hands should be a little wider than shoulder width apart. ? Your palms should face away from your back. 2. Keeping your elbows straight and your shoulder muscles relaxed, move the stick away from your body until you feel a stretch in your shoulders (extension). ? Avoid shrugging your shoulders while you move the stick. Keep your shoulder blades tucked  down toward the middle of your back. 3. Hold for __________ seconds. 4. Slowly return to the starting position. Repeat __________ times. Complete this exercise __________ times a day. Range-of-motion exercises Pendulum  1. Stand near a wall or a surface that you can hold onto for balance. 2. Bend at the waist and let your left / right arm hang straight down. Use your other arm to support you. Keep your back straight and do not lock your knees. 3. Relax your left / right arm and shoulder muscles, and move your hips and your trunk so your left / right arm swings freely. Your arm should swing because of the motion of your body, not because you are using your arm or shoulder muscles. 4. Keep moving your hips and trunk so your arm swings in the following directions, as told by your health care provider: ? Side to side. ? Forward and backward. ? In clockwise and counterclockwise circles. 5. Continue each motion for __________ seconds, or for as long as told by your health care provider. 6. Slowly return to the starting position. Repeat __________ times. Complete this exercise __________ times a day. Shoulder flexion, standing  1. Stand and hold a broomstick, a cane, or a similar object. Place your hands a little more than shoulder width apart on the object. Your left / right hand should be palm up, and your other hand should be palm down. 2. Keep your elbow straight and your shoulder muscles relaxed. Push the stick up with your healthy arm to   raise your left / right arm in front of your body, and then over your head until you feel a stretch in your shoulder (flexion). ? Avoid shrugging your shoulder while you raise your arm. Keep your shoulder blade tucked down toward the middle of your back. 3. Hold for __________ seconds. 4. Slowly return to the starting position. Repeat __________ times. Complete this exercise __________ times a day. Shoulder abduction, standing 1. Stand and hold a broomstick,  a cane, or a similar object. Place your hands a little more than shoulder width apart on the object. Your left / right hand should be palm up, and your other hand should be palm down. 2. Keep your elbow straight and your shoulder muscles relaxed. Push the object across your body toward your left / right side. Raise your left / right arm to the side of your body (abduction) until you feel a stretch in your shoulder. ? Do not raise your arm above shoulder height unless your health care provider tells you to do that. ? If directed, raise your arm over your head. ? Avoid shrugging your shoulder while you raise your arm. Keep your shoulder blade tucked down toward the middle of your back. 3. Hold for __________ seconds. 4. Slowly return to the starting position. Repeat __________ times. Complete this exercise __________ times a day. Internal rotation  1. Place your left / right hand behind your back, palm up. 2. Use your other hand to dangle an exercise band, a towel, or a similar object over your shoulder. Grasp the band with your left / right hand so you are holding on to both ends. 3. Gently pull up on the band until you feel a stretch in the front of your left / right shoulder. The movement of your arm toward the center of your body is called internal rotation. ? Avoid shrugging your shoulder while you raise your arm. Keep your shoulder blade tucked down toward the middle of your back. 4. Hold for __________ seconds. 5. Release the stretch by letting go of the band and lowering your hands. Repeat __________ times. Complete this exercise __________ times a day. Strengthening exercises External rotation  1. Sit in a stable chair without armrests. 2. Secure an exercise band to a stable object at elbow height on your left / right side. 3. Place a soft object, such as a folded towel or a small pillow, between your left / right upper arm and your body to move your elbow about 4 inches (10 cm) away  from your side. 4. Hold the end of the exercise band so it is tight and there is no slack. 5. Keeping your elbow pressed against the soft object, slowly move your forearm out, away from your abdomen (external rotation). Keep your body steady so only your forearm moves. 6. Hold for __________ seconds. 7. Slowly return to the starting position. Repeat __________ times. Complete this exercise __________ times a day. Shoulder abduction  1. Sit in a stable chair without armrests, or stand up. 2. Hold a __________ weight in your left / right hand, or hold an exercise band with both hands. 3. Start with your arms straight down and your left / right palm facing in, toward your body. 4. Slowly lift your left / right hand out to your side (abduction). Do not lift your hand above shoulder height unless your health care provider tells you that this is safe. ? Keep your arms straight. ? Avoid shrugging your shoulder while you   do this movement. Keep your shoulder blade tucked down toward the middle of your back. 5. Hold for __________ seconds. 6. Slowly lower your arm, and return to the starting position. Repeat __________ times. Complete this exercise __________ times a day. Shoulder extension 1. Sit in a stable chair without armrests, or stand up. 2. Secure an exercise band to a stable object in front of you so it is at shoulder height. 3. Hold one end of the exercise band in each hand. Your palms should face each other. 4. Straighten your elbows and lift your hands up to shoulder height. 5. Step back, away from the secured end of the exercise band, until the band is tight and there is no slack. 6. Squeeze your shoulder blades together as you pull your hands down to the sides of your thighs (extension). Stop when your hands are straight down by your sides. Do not let your hands go behind your body. 7. Hold for __________ seconds. 8. Slowly return to the starting position. Repeat __________ times.  Complete this exercise __________ times a day. Shoulder row 1. Sit in a stable chair without armrests, or stand up. 2. Secure an exercise band to a stable object in front of you so it is at waist height. 3. Hold one end of the exercise band in each hand. Position your palms so that your thumbs are facing the ceiling (neutral position). 4. Bend each of your elbows to a 90-degree angle (right angle) and keep your upper arms at your sides. 5. Step back until the band is tight and there is no slack. 6. Slowly pull your elbows back behind you. 7. Hold for __________ seconds. 8. Slowly return to the starting position. Repeat __________ times. Complete this exercise __________ times a day. Shoulder press-ups  1. Sit in a stable chair that has armrests. Sit upright, with your feet flat on the floor. 2. Put your hands on the armrests so your elbows are bent and your fingers are pointing forward. Your hands should be about even with the sides of your body. 3. Push down on the armrests and use your arms to lift yourself off the chair. Straighten your elbows and lift yourself up as much as you comfortably can. ? Move your shoulder blades down, and avoid letting your shoulders move up toward your ears. ? Keep your feet on the ground. As you get stronger, your feet should support less of your body weight as you lift yourself up. 4. Hold for __________ seconds. 5. Slowly lower yourself back into the chair. Repeat __________ times. Complete this exercise __________ times a day. Wall push-ups  1. Stand so you are facing a stable wall. Your feet should be about one arm-length away from the wall. 2. Lean forward and place your palms on the wall at shoulder height. 3. Keep your feet flat on the floor as you bend your elbows and lean forward toward the wall. 4. Hold for __________ seconds. 5. Straighten your elbows to push yourself back to the starting position. Repeat __________ times. Complete this exercise  __________ times a day. This information is not intended to replace advice given to you by your health care provider. Make sure you discuss any questions you have with your health care provider. Document Revised: 02/12/2019 Document Reviewed: 11/20/2018 Elsevier Patient Education  2020 [employer] Inc[employer]  

## 2020-03-23 NOTE — Progress Notes (Signed)
Patient ID: Stephen [name], male    DOB: 03-25-1961, 59 y.o.   MRN: [id number]   Chief Complaint  Patient presents with  . Shoulder Pain   Subjective:    Shoulder Pain  The pain is present in the right shoulder. Episode onset: Injured about 30 years ago with no treatment. The problem occurs intermittently. The problem has been gradually worsening. The pain is moderate. Associated symptoms include stiffness. Treatments tried: tylenol doesn't help and very little ibuprofen due to kidney issues.    Pt had shoulder injury on rt- with 2nd deg tear in 1991.  Never had treatment for it occ flares up with pain when doing exercise or hard labor.  Over weekend flared up when doing some flooring work.  Took tylenol and ibuprofen.  mild relief.  Hard to sleep on it or move it when hurting.  It has resolved today, but wanted to come in to discuss the on-going issue.  No numbness/tingling down the arm.   Pt also reporting intermittent knee pain on right, had acl repair many years ago.  Doesn't take anything for it other than tylenol.  Got flared up over weekend when doing flooring work on his knees.  Pain and swelling resolved now.    Medical History Stephen [name] has no past medical history on file.   Outpatient Encounter Medications as of 03/23/2020  Medication Sig  . benzonatate (TESSALON) 100 MG capsule Take 1 capsule (100 mg total) by mouth every 8 (eight) hours. (Patient not taking: Reported on 03/23/2020)  . buPROPion (WELLBUTRIN XL) 300 MG 24 hr tablet TAKE (1) TABLET BY MOUTH ONCE DAILY.  . cetirizine (ZYRTEC) 10 MG tablet Take 1 tablet (10 mg total) by mouth daily. (Patient not taking: Reported on 03/23/2020)  . diclofenac Sodium (VOLTAREN) 1 % GEL Apply 2 g topically 4 (four) times daily. Apply to shoulder for 2 wks.  . fluticasone (FLONASE) 50 MCG/ACT nasal spray Place 2 sprays into both nostrils daily. (Patient not taking: Reported on 03/23/2020)  . Multiple Vitamin (MULTIVITAMIN) tablet  Take 1 tablet by mouth daily.  Marland Kitchen OVER THE COUNTER MEDICATION Vit D once daily  . vardenafil (LEVITRA) 20 MG tablet Take 1 tablet (20 mg total) by mouth daily as needed for erectile dysfunction.   No facility-administered encounter medications on file as of 03/23/2020.     Review of Systems  Musculoskeletal: Positive for arthralgias (rt shoulder pain) and stiffness. Negative for back pain and joint swelling.     Vitals BP 130/86   Pulse 82   Temp (!) 97.2 F (36.2 C)   Wt 200 lb 6.4 oz (90.9 kg)   SpO2 98%   BMI 27.95 kg/m   Objective:   Physical Exam Vitals and nursing note reviewed.  Constitutional:      General: He is not in acute distress.    Appearance: Normal appearance. He is not ill-appearing.  Musculoskeletal:        General: No swelling or tenderness. Normal range of motion.     Comments: +neer's on right.  Negative Hawkins on right. No ttp on anterior shoulder.  Normal rom of shoulder-rt.  Skin:    General: Skin is warm and dry.     Findings: No rash.  Neurological:     General: No focal deficit present.     Mental Status: He is alert and oriented to person, place, and time.     Cranial Nerves: No cranial nerve deficit.      Assessment and  Plan   1. Chronic right shoulder pain - DG Shoulder Right; Future - Ambulatory referral to Physical Therapy    Offered ortho referral, prednisone taper, or pain medication for this pain.  At this time pt wanting to hold off and just try voltaren gel and tylenol.  Pt unable to take nsaids due to kidney disease.  Will order xrays. Pt not wanting to use opiates.  Pt not wanting to see ortho at this time for injection. If continuing to have pain and not improving, may need MRI and/or referral to ortho.  Will cont to monitor and call back if worsening.  F/u prn.

## 2020-03-27 ENCOUNTER — Telehealth: Payer: Self-pay | Admitting: Family Medicine

## 2020-03-27 ENCOUNTER — Other Ambulatory Visit: Payer: Self-pay | Admitting: *Deleted

## 2020-03-27 DIAGNOSIS — G8929 Other chronic pain: Secondary | ICD-10-CM

## 2020-03-27 NOTE — Telephone Encounter (Signed)
FYI --referral for occupational therapy for shoulder was ordered in epic.

## 2020-03-27 NOTE — Telephone Encounter (Signed)
ok 

## 2020-03-27 NOTE — Telephone Encounter (Signed)
Stephen [name] outpatient rehab called and said that the order for Physical therapy on his shoulder needs to be changed to occupational therapy instead.

## 2020-04-18 ENCOUNTER — Encounter: Payer: Self-pay | Admitting: Family Medicine

## 2020-04-19 ENCOUNTER — Encounter: Payer: Self-pay | Admitting: Nurse Practitioner

## 2020-04-19 ENCOUNTER — Ambulatory Visit (INDEPENDENT_AMBULATORY_CARE_PROVIDER_SITE_OTHER): Payer: BC Managed Care – PPO | Admitting: Nurse Practitioner

## 2020-04-19 ENCOUNTER — Other Ambulatory Visit: Payer: Self-pay

## 2020-04-19 VITALS — BP 138/76 | Temp 97.7°F | Ht 71.0 in | Wt 200.8 lb

## 2020-04-19 DIAGNOSIS — M7752 Other enthesopathy of left foot: Secondary | ICD-10-CM

## 2020-04-19 DIAGNOSIS — M65272 Calcific tendinitis, left ankle and foot: Secondary | ICD-10-CM | POA: Diagnosis not present

## 2020-04-19 NOTE — Patient Instructions (Signed)
[employer]'s Emergency Medicine: Concepts and Clinical Practice (9th ed., pp. 1392-1401). [place], [place]: [employer]. Retrieved from https://www.clinicalkey.com/#!/content/book/3-[email or web address]-[email or web address]?scrollTo=%[email or web address]">  Achilles Tendinitis  Achilles tendinitis is inflammation of the tough, cord-like band that attaches the lower leg muscles to the heel bone (Achilles tendon). This is usually caused by overusing the tendon and the ankle joint. Achilles tendinitis usually gets better over time with treatment and caring for yourself at home. It can take weeks or months to heal completely. What are the causes? This condition may be caused by:  A sudden increase in exercise or activity, such as running.  Doing the same exercises or activities, such as jumping, over and over.  Not warming up calf muscles before exercising.  Exercising in shoes that are worn out or not made for exercise.  Having arthritis or a bone growth (spur) on the back of the heel bone. This can rub against the tendon and hurt it.  Age-related wear and tear. Tendons become less flexible with age and are more likely to be injured. What are the signs or symptoms? Common symptoms of this condition include:  Pain in the Achilles tendon or in the back of the leg, just above the heel. The pain usually gets worse with exercise.  Stiffness or soreness in the back of the leg, especially in the morning.  Swelling of the skin over the Achilles tendon.  Thickening of the tendon.  Trouble standing on tiptoe. How is this diagnosed? This condition is diagnosed based on your symptoms and a physical exam. You may have tests, including:  X-rays.  MRI. How is this treated? The goal of treatment is to relieve symptoms and help your injury heal. Treatment may include:  Decreasing or stopping activities that caused the tendinitis. This may mean switching to low-impact exercises like biking or swimming.  Icing the injured  area.  Doing physical therapy, including strengthening and stretching exercises.  Taking NSAIDs, such as ibuprofen, to help relieve pain and swelling.  Using supportive shoes, wraps, heel lifts, or a walking boot (air cast).  Having surgery. This may be done if your symptoms do not improve after other treatments.  Using high-energy shock wave impulses to stimulate the healing process (extracorporeal shock wave therapy). This is rare.  Having an injection of medicines that help relieve inflammation (corticosteroids). This is rare. Follow these instructions at home: If you have an air cast:  Wear the air cast as told by your health care provider. Remove it only as told by your health care provider.  Loosen it if your toes tingle, become numb, or turn cold and blue.  Keep it clean.  If the air cast is not waterproof: ? Do not let it get wet. ? Cover it with a watertight covering when you take a bath or shower. Managing pain, stiffness, and swelling   If directed, put ice on the injured area. To do this: ? If you have a removable air cast, remove it as told by your health care provider. ? Put ice in a plastic bag. ? Place a towel between your skin and the bag. ? Leave the ice on for 20 minutes, 2-3 times a day.  Move your toes often to reduce stiffness and swelling.  Raise (elevate) your foot above the level of your heart while you are sitting or lying down. Activity  Gradually return to your normal activities as told by your health care provider. Ask your health care provider what activities are safe for you.  Do not do   activities that cause pain.  Consider doing low-impact exercises, like cycling or swimming.  Ask your health care provider when it is safe to drive if you have an air cast on your foot.  If physical therapy was prescribed, do exercises as told by your health care provider or physical therapist. General instructions  If directed, wrap your foot with an  elastic bandage or other wrap. This can help to keep your tendon from moving too much while it heals. Your health care provider will show you how to wrap your foot correctly.  Wear supportive shoes or heel lifts only as told by your health care provider.  Take over-the-counter and prescription medicines only as told by your health care provider.  Keep all follow-up visits as told by your health care provider. This is important. Contact a health care provider if you:  Have symptoms that get worse.  Have pain that does not get better with medicine.  Develop new, unexplained symptoms.  Develop warmth and swelling in your foot.  Have a fever. Get help right away if you:  Have a sudden popping sound or sensation in your Achilles tendon followed by severe pain.  Cannot move your toes or foot.  Cannot put any weight on your foot.  Your foot or toes become numb and look white or blue even after loosening your bandage or air cast. Summary  Achilles tendinitis is inflammation of the tough, cord-like band that attaches the lower leg muscles to the heel bone (Achilles tendon).  This condition is usually caused by overusing the tendon and the ankle joint. It can also be caused by arthritis or normal aging.  The most common symptoms of this condition include pain, swelling, or stiffness in the Achilles tendon or in the back of the leg.  This condition is usually treated by decreasing or stopping activities that caused the tendinitis, icing the injured area, taking NSAIDs, and doing physical therapy. This information is not intended to replace advice given to you by your health care provider. Make sure you discuss any questions you have with your health care provider. Document Revised: 03/08/2019 Document Reviewed: 03/08/2019 Elsevier Patient Education  2020 [employer] Inc.  

## 2020-04-19 NOTE — Progress Notes (Signed)
   Subjective:    Patient ID: Stephen [name], male    DOB: 09-Nov-1960, 59 y.o.   MRN: [id number]  HPI left heel pain for 3 months. Was walking for exercise and then started trying to run and then started having pain. States he cannot take ibuprofen due to kidney function.  About 3 months ago patient began walking/running trying to get back into shape.  Began having pain around the Achilles area in both legs.  Backed off the exercise used ice and rest, slowly begin to improve.  Occurs off and on.  A few weeks ago stepped off of a chair and felt an immediate severe pain in the left Achilles area.  Since then has had swelling and pain.  Can bear weight.  Difficulty walking at times.  Worse in the mornings.  Review of Systems     Objective:   Physical Exam NAD.  Alert, oriented.  Normal passive and active ROM of the left ankle.  Strong DP pulse.  Skin warm and dry.  Distinct tenderness noted along the left Achilles particularly towards the lower area where there is a localized area of mild edema, no erythema.  This is the area of maximal tenderness.       Assessment & Plan:  Left ankle tendinitis - Plan: DG Ankle Complete Left  Discussed diagnosis of Achilles tendinitis.  Explained that there may be at least a partial tear.  Patient defers Ortho consultation at this time.  Given written and verbal information on Achilles tendinitis.  Discussed ankle exercises.  Given a written prescription for an Aircast.  Patient cannot take oral NSAIDs, recommend he continue Voltaren gel as directed.  Avoid any strenuous exercises such as running or jumping for the next few weeks.  Patient to call back to the office in 2 to 3 weeks if no improvement, sooner if worse.  X-ray pending.

## 2020-04-21 ENCOUNTER — Ambulatory Visit (HOSPITAL_COMMUNITY)
Admission: RE | Admit: 2020-04-21 | Discharge: 2020-04-21 | Disposition: A | Discharge status: Home / Self Care | Payer: BC Managed Care – PPO | Source: Ambulatory Visit | Attending: Nurse Practitioner | Admitting: Nurse Practitioner

## 2020-04-21 ENCOUNTER — Encounter: Payer: Self-pay | Admitting: Nurse Practitioner

## 2020-04-21 ENCOUNTER — Encounter (HOSPITAL_COMMUNITY): Payer: Self-pay | Admitting: Occupational Therapy

## 2020-04-21 ENCOUNTER — Ambulatory Visit (HOSPITAL_COMMUNITY): Payer: BC Managed Care – PPO | Attending: Family Medicine | Admitting: Occupational Therapy

## 2020-04-21 ENCOUNTER — Other Ambulatory Visit: Payer: Self-pay

## 2020-04-21 DIAGNOSIS — M7752 Other enthesopathy of left foot: Secondary | ICD-10-CM | POA: Diagnosis not present

## 2020-04-21 DIAGNOSIS — G8929 Other chronic pain: Secondary | ICD-10-CM | POA: Insufficient documentation

## 2020-04-21 DIAGNOSIS — M7989 Other specified soft tissue disorders: Secondary | ICD-10-CM | POA: Diagnosis not present

## 2020-04-21 DIAGNOSIS — M25511 Pain in right shoulder: Secondary | ICD-10-CM | POA: Insufficient documentation

## 2020-04-21 DIAGNOSIS — R29898 Other symptoms and signs involving the musculoskeletal system: Secondary | ICD-10-CM | POA: Diagnosis not present

## 2020-04-21 DIAGNOSIS — M25572 Pain in left ankle and joints of left foot: Secondary | ICD-10-CM | POA: Diagnosis not present

## 2020-04-21 NOTE — Patient Instructions (Signed)
  1) Flexion Wall Stretch    Face wall, place affected handon wall in front of you. Slide hand up the wall  and lean body in towards the wall. Hold for 10 seconds. Repeat 3-5 times. 1-2 times/day.     2) Towel Stretch with Internal Rotation   Or     Gently pull up (or to the side) your affected arm  behind your back with the assist of a towel. Hold 10 seconds, repeat 3-5 times. 1-2 times/day.             3) Corner Stretch    Stand at a corner of a wall, place your arms on the walls with elbows bent. Lean into the corner until a stretch is felt along the front of your chest and/or shoulders. Hold for 10 seconds. Repeat 3-5X, 1-2 times/day.    4) Posterior Capsule Stretch    Bring the involved arm across chest. Grasp elbow and pull toward chest until you feel a stretch in the back of the upper arm and shoulder. Hold 10 seconds. Repeat 3-5X. Complete 1-2 times/day.     Strengthening Exercises: Use a 1 to 3lb weight (or a soup can or water bottle). Repeat all exercises 10-15 times, 1-2 times per day.  1) Shoulder Protraction    Begin with elbows by your side, slowly "punch" straight out in front of you.      2) Shoulder Flexion  Standing:         Begin with arms at your side with thumbs pointed up, slowly raise both arms up and forward towards overhead.               3) Horizontal abduction/adduction  Standing:           Begin with arms straight out in front of you, bring out to the side in at "T" shape. Keep arms straight entire time.                 4) Internal & External Rotation  Standing:   Stand with elbows at the side and elbows bent 90 degrees. Move your forearms away from your body, then bring back inward toward the body.     5) Shoulder Abduction  Standing:       Lying on your back begin with your arms flat on the table next to your side. Slowly move your arms out to the side so that they go overhead, in a  jumping jack or snow angel movement.

## 2020-04-21 NOTE — Therapy (Signed)
[facility] [address] [place], [place], [address] Phone: [phone]   Fax:  [phone]  Occupational Therapy Evaluation  Patient Details  Name: Stephen [name] MRN: [id number] Date of Birth: 08-Sep-1961 Referring Provider (OT): Dr. Laroy Apple   Encounter Date: 04/21/2020   OT End of Session - 04/21/20 1200    Visit Number 1    Number of Visits 1    Date for OT Re-Evaluation 04/24/20    Authorization Type [employer]    Authorization Time Period 30 visit limit    Authorization - Visit Number 1    Authorization - Number of Visits 30    OT Start Time 1117    OT Stop Time 1145    OT Time Calculation (min) 28 min    Activity Tolerance Patient tolerated treatment well    Behavior During Therapy [facility] for tasks assessed/performed           History reviewed. No pertinent past medical history.  History reviewed. No pertinent surgical history.  There were no vitals filed for this visit.   Subjective Assessment - 04/21/20 1133    Subjective  S: I think it's feeling back to it's normal state.    Pertinent History Pt is a 59 y/o male s/p grade II AC joint separation in 1991, who experienced significant weakness and pain after a lot of manual use recently lasting for approximately 10 days. Pt was referred to occupational therapy for evaluation and treatment by Dr. Laroy Apple.    Patient Stated Goals To be able to use my arm like I normally do.    Currently in Pain? No/denies             [facility] OT Assessment - 04/21/20 1117      Assessment   Medical Diagnosis Chronic right shoulder pain    Referring Provider (OT) Dr. Laroy Apple    Onset Date/Surgical Date --   chronic-1991   Hand Dominance Right    Next MD Visit None scheduled    Prior Therapy None      Precautions   Precautions None      Balance Screen   Has the patient fallen in the past 6 months No    Has the patient had a decrease in activity level because of a  fear of falling?  No    Is the patient reluctant to leave their home because of a fear of falling?  No      Prior Function   Level of Independence Independent    Vocation Full time employment    Vocation Requirements office work    Leisure riding motorcycle      ADL   ADL comments pt is having difficulty with reaching overhead and behind back. Pt has decreased strength in RUE since initial injury      Written Expression   Dominant Hand Right      Cognition   Overall Cognitive Status Within Functional Limits for tasks assessed      ROM / Strength   AROM / PROM / Strength AROM;Strength      AROM   Overall AROM Comments Assessed seated, er/IR adducted     AROM Assessment Site Shoulder    Right/Left Shoulder Right    Right Shoulder Flexion 160 Degrees    Right Shoulder ABduction 155 Degrees    Right Shoulder Internal Rotation 90 Degrees    Right Shoulder External Rotation 90 Degrees      Strength  Overall Strength Comments Assessed seated, er/IR adducted     Strength Assessment Site Shoulder    Right/Left Shoulder Right    Right Shoulder Flexion 5/5    Right Shoulder ABduction 5/5    Right Shoulder Internal Rotation 5/5    Right Shoulder External Rotation 5/5                           OT Education - 04/21/20 1133    Education Details shoulder stretches, shoulder strengthening/A/ROM    Person(s) Educated Patient    Methods Explanation;Demonstration;Handout    Comprehension Verbalized understanding;Returned demonstration            OT Short Term Goals - 04/21/20 1204      OT SHORT TERM GOAL #1   Title Pt will be provided with and educated on HEP to improve mobility required for daily tasks.    Time 1    Period Days    Status Achieved    Target Date 04/21/20                    Plan - 04/21/20 1201    Clinical Impression Statement A: Pt is a 59 y/o male presenting s/p grade II separation in 1991 with flare up recently after completing  manual labor. Increased weakness lasted approximately 10 days, and pt is reporting return to his baseline since that time. Pt with good ROM and strength. Pt provided with maintenance HEP including shoulder stretches and gentle strengthening.    OT Occupational Profile and History Problem Focused Assessment - Including review of records relating to presenting problem    Occupational performance deficits (Please refer to evaluation for details): ADL's    Body Structure / Function / Physical Skills Pain;ROM;Strength    Rehab Potential Good    Clinical Decision Making Limited treatment options, no task modification necessary    Comorbidities Affecting Occupational Performance: None    Modification or Assistance to Complete Evaluation  No modification of tasks or assist necessary to complete eval    OT Frequency One time visit    OT Treatment/Interventions Patient/family education    Plan P: Pt provided with HEP and educated on frequency and duration, as well as safe exercises to complete during work outs.    OT Home Exercise Plan 6/18: shoulder stretches, gentle strengthening    Consulted and Agree with Plan of Care Patient           Patient will benefit from skilled therapeutic intervention in order to improve the following deficits and impairments:   Body Structure / Function / Physical Skills: Pain, ROM, Strength       Visit Diagnosis: Chronic right shoulder pain  Other symptoms and signs involving the musculoskeletal system    Problem List Patient Active Problem List   Diagnosis Date Noted  . Generalized anxiety disorder 03/17/2018  . Depression, recurrent ([place]) 03/17/2018   Guadelupe Sabin, OTR/L  [phone] 04/21/2020, 12:05 PM  [facility] [address] [place], [place], [address] Phone: [phone]   Fax:  [phone]  Name: Stephen [name] MRN: [id number] Date of Birth: [birth year]

## 2020-04-25 ENCOUNTER — Telehealth: Payer: Self-pay | Admitting: Family Medicine

## 2020-04-25 NOTE — Telephone Encounter (Signed)
Scheduled 8/31

## 2020-04-25 NOTE — Telephone Encounter (Signed)
Patient is requesting refill on Bupropion 300 mg called into [facility] pharmacy last filled 01/26/20

## 2020-04-25 NOTE — Telephone Encounter (Signed)
Contact patient to have patient set up appt in 11-12 weeks. Thank you!

## 2020-04-25 NOTE — Telephone Encounter (Signed)
Needs appt in 11-12 wks. Thx. Dr. Ladona Ridgel

## 2020-04-25 NOTE — Telephone Encounter (Signed)
error 

## 2020-04-25 NOTE — Telephone Encounter (Signed)
Done. Thx.

## 2020-04-27 ENCOUNTER — Other Ambulatory Visit: Payer: Self-pay | Admitting: *Deleted

## 2020-04-27 ENCOUNTER — Telehealth: Payer: Self-pay | Admitting: *Deleted

## 2020-04-27 NOTE — Telephone Encounter (Signed)
Fax from [email or web address]. Request for refill on vadenafil 20mg  could not find this pharm in [place]. If you choose to refill. It will need to be printed and faxed.  Fax number [phone]  Pt last med check 09/14/19 and has upcoming appt on 06/06/20  Last labs 09/02/19 lipid, liver, bmp, psa, ra and sed rate

## 2020-04-28 MED ORDER — VARDENAFIL HCL 20 MG PO TABS: 20.0000 mg | ORAL_TABLET | Freq: Every day | ORAL | 11 refills | Status: DC | PRN

## 2020-04-28 NOTE — Telephone Encounter (Signed)
Prescription faxed to pharmacy as requested. 

## 2020-04-28 NOTE — Telephone Encounter (Signed)
Gave script to [facility], ready to fax. Thx. Dr. Karie Schwalbe

## 2020-07-04 ENCOUNTER — Ambulatory Visit: Payer: BC Managed Care – PPO | Admitting: Family Medicine

## 2020-07-24 ENCOUNTER — Other Ambulatory Visit: Payer: Self-pay | Admitting: Family Medicine

## 2020-08-08 ENCOUNTER — Encounter: Payer: Self-pay | Admitting: Family Medicine

## 2020-08-08 ENCOUNTER — Other Ambulatory Visit: Payer: Self-pay

## 2020-08-08 ENCOUNTER — Ambulatory Visit (INDEPENDENT_AMBULATORY_CARE_PROVIDER_SITE_OTHER): Payer: BC Managed Care – PPO | Admitting: Family Medicine

## 2020-08-08 VITALS — BP 136/88 | HR 83 | Temp 97.5°F | Wt 204.4 lb

## 2020-08-08 DIAGNOSIS — R030 Elevated blood-pressure reading, without diagnosis of hypertension: Secondary | ICD-10-CM | POA: Diagnosis not present

## 2020-08-08 DIAGNOSIS — F339 Major depressive disorder, recurrent, unspecified: Secondary | ICD-10-CM | POA: Diagnosis not present

## 2020-08-08 DIAGNOSIS — Z125 Encounter for screening for malignant neoplasm of prostate: Secondary | ICD-10-CM | POA: Diagnosis not present

## 2020-08-08 DIAGNOSIS — R7989 Other specified abnormal findings of blood chemistry: Secondary | ICD-10-CM | POA: Diagnosis not present

## 2020-08-08 DIAGNOSIS — F411 Generalized anxiety disorder: Secondary | ICD-10-CM

## 2020-08-08 MED ORDER — AMLODIPINE BESYLATE 2.5 MG PO TABS: 2.5000 mg | ORAL_TABLET | Freq: Every day | ORAL | 1 refills | Status: DC

## 2020-08-08 MED ORDER — BUPROPION HCL ER (XL) 300 MG PO TB24: 300.0000 mg | ORAL_TABLET | Freq: Every day | ORAL | 1 refills | Status: DC

## 2020-08-08 NOTE — Progress Notes (Signed)
Patient ID: Stephen [name], male    DOB: 08/25/1961, 59 y.o.   MRN: [id number]   Chief Complaint  Patient presents with  . Anxiety  . Depression   Subjective:    HPI  F/u on anxiety and depression-  Started around 2000.  Went through a divorce and started some medications.  Had grandfather that committed suicide. Has been on it for years. Taking it since 2019.  Tried zoloft and not best.  Then tried lexapro. Then went back to zoloft, then changed to wellbutrin. Helping with irritability. Having some perfectionist personality and helping with it. Has helped with anxiety. Has tried to get off it and then restarted due to wife thinking it was helping.   Noticing last few visits having slightly elevated BP.  No chest pain, sob, headache, or leg swelling.  Not on medications for this. Mother with h/o htn.  Kidney disease in his family history. Has had elevated Cr last few visits.  Medical History Stephen [name] has no past medical history on file.   Outpatient Encounter Medications as of 08/08/2020  Medication Sig  . amLODipine (NORVASC) 2.5 MG tablet Take 1 tablet (2.5 mg total) by mouth daily.  Marland Kitchen buPROPion (WELLBUTRIN XL) 300 MG 24 hr tablet Take 1 tablet (300 mg total) by mouth daily.  . diclofenac Sodium (VOLTAREN) 1 % GEL Apply 2 g topically 4 (four) times daily. Apply to shoulder for 2 wks. (Patient not taking: Reported on 08/08/2020)  . Multiple Vitamin (MULTIVITAMIN) tablet Take 1 tablet by mouth daily.  Marland Kitchen OVER THE COUNTER MEDICATION Vit D once daily  . vardenafil (LEVITRA) 20 MG tablet Take 1 tablet (20 mg total) by mouth daily as needed for erectile dysfunction.  . [DISCONTINUED] buPROPion (WELLBUTRIN XL) 300 MG 24 hr tablet TAKE (1) TABLET BY MOUTH ONCE DAILY.   No facility-administered encounter medications on file as of 08/08/2020.     Review of Systems  Constitutional: Negative for chills and fever.  HENT: Negative for congestion, rhinorrhea and sore throat.     Respiratory: Negative for cough, shortness of breath and wheezing.   Cardiovascular: Negative for chest pain and leg swelling.  Gastrointestinal: Negative for abdominal pain, diarrhea, nausea and vomiting.  Genitourinary: Negative for dysuria and frequency.  Skin: Negative for rash.  Neurological: Negative for dizziness, weakness and headaches.  Psychiatric/Behavioral: Positive for dysphoric mood. Negative for agitation, behavioral problems and sleep disturbance. The patient is nervous/anxious.        +controlled with meds.     Vitals BP 136/88   Pulse 83   Temp (!) 97.5 F (36.4 C)   Wt 204 lb 6.4 oz (92.7 kg)   SpO2 99%   BMI 28.51 kg/m   Objective:   Physical Exam Vitals and nursing note reviewed.  Constitutional:      General: He is not in acute distress.    Appearance: Normal appearance. He is not ill-appearing.  Cardiovascular:     Rate and Rhythm: Normal rate and regular rhythm.     Pulses: Normal pulses.     Heart sounds: Normal heart sounds.  Pulmonary:     Effort: Pulmonary effort is normal. No respiratory distress.     Breath sounds: Normal breath sounds.  Musculoskeletal:        General: Normal range of motion.  Skin:    General: Skin is warm and dry.     Findings: No rash.  Neurological:     General: No focal deficit present.  Mental Status: He is alert and oriented to person, place, and time.     Cranial Nerves: No cranial nerve deficit.  Psychiatric:        Mood and Affect: Mood normal.        Behavior: Behavior normal.        Thought Content: Thought content normal.        Judgment: Judgment normal.      Assessment and Plan   1. Depression, recurrent ([place])  2. Elevated blood pressure reading - CBC - CMP14+EGFR - Lipid panel  3. Screening PSA (prostate specific antigen) - PSA  4. Elevated serum creatinine  5. Generalized anxiety disorder   Htn/ elevated Cr- elevated bp last 2 visits.  Has elevated Cr at 1.5 in 2020.  Will  recheck labs.  advising to dec any salt intake. Sent in 2.47m norvasc, pt wanting to check bp 2x per day and call in with the numbers if seeing elevated still will pick up script.   Depression/anxiety- stable. Cont wellbutrin.  Pt to get labs this week.  F/u 636mor prn.

## 2020-08-23 DIAGNOSIS — Z125 Encounter for screening for malignant neoplasm of prostate: Secondary | ICD-10-CM | POA: Diagnosis not present

## 2020-08-23 DIAGNOSIS — R030 Elevated blood-pressure reading, without diagnosis of hypertension: Secondary | ICD-10-CM | POA: Diagnosis not present

## 2020-08-24 LAB — CMP14+EGFR
ALT: 18 IU/L (ref 0–44)
AST: 18 IU/L (ref 0–40)
Albumin/Globulin Ratio: 1.7 (ref 1.2–2.2)
Albumin: 4.3 g/dL (ref 3.8–4.9)
Alkaline Phosphatase: 70 IU/L (ref 44–121)
BUN/Creatinine Ratio: 11 (ref 9–20)
BUN: 14 mg/dL (ref 6–24)
Bilirubin Total: 0.3 mg/dL (ref 0.0–1.2)
CO2: 21 mmol/L (ref 20–29)
Calcium: 9.5 mg/dL (ref 8.7–10.2)
Chloride: 103 mmol/L (ref 96–106)
Creatinine, Ser: 1.32 mg/dL — ABNORMAL HIGH (ref 0.76–1.27)
GFR calc Af Amer: 68 mL/min/{1.73_m2} (ref >59)
GFR calc non Af Amer: 59 mL/min/{1.73_m2} — ABNORMAL LOW (ref >59)
Globulin, Total: 2.6 g/dL (ref 1.5–4.5)
Glucose: 89 mg/dL (ref 65–99)
Potassium: 4.3 mmol/L (ref 3.5–5.2)
Sodium: 141 mmol/L (ref 134–144)
Total Protein: 6.9 g/dL (ref 6.0–8.5)

## 2020-08-24 LAB — CBC
Hematocrit: 40.1 % (ref 37.5–51.0)
Hemoglobin: 14.1 g/dL (ref 13.0–17.7)
MCH: 33.7 pg — ABNORMAL HIGH (ref 26.6–33.0)
MCHC: 35.2 g/dL (ref 31.5–35.7)
MCV: 96 fL (ref 79–97)
Platelets: 226 10*3/uL (ref 150–450)
RBC: 4.19 x10E6/uL (ref 4.14–5.80)
RDW: 12.8 % (ref 11.6–15.4)
WBC: 6 10*3/uL (ref 3.4–10.8)

## 2020-08-24 LAB — LIPID PANEL
Chol/HDL Ratio: 2.6 ratio (ref 0.0–5.0)
Cholesterol, Total: 140 mg/dL (ref 100–199)
HDL: 54 mg/dL (ref >39)
LDL Chol Calc (NIH): 75 mg/dL (ref 0–99)
Triglycerides: 50 mg/dL (ref 0–149)
VLDL Cholesterol Cal: 11 mg/dL (ref 5–40)

## 2020-08-24 LAB — PSA: Prostate Specific Ag, Serum: 0.9 ng/mL (ref 0.0–4.0)

## 2020-08-25 ENCOUNTER — Encounter: Payer: Self-pay | Admitting: Family Medicine

## 2020-08-25 DIAGNOSIS — R7989 Other specified abnormal findings of blood chemistry: Secondary | ICD-10-CM | POA: Insufficient documentation

## 2020-09-15 MED ORDER — AMLODIPINE BESYLATE 5 MG PO TABS: 5.0000 mg | ORAL_TABLET | Freq: Every day | ORAL | 0 refills | Status: DC

## 2020-09-17 MED ORDER — AMLODIPINE BESYLATE 10 MG PO TABS: 10.0000 mg | ORAL_TABLET | Freq: Every day | ORAL | 1 refills | Status: DC

## 2020-09-17 NOTE — Addendum Note (Signed)
Addended by: Annalee Genta on: 09/17/2020 01:44 PM   Modules accepted: Orders

## 2020-09-18 ENCOUNTER — Other Ambulatory Visit: Payer: Self-pay | Admitting: *Deleted

## 2020-09-18 NOTE — Progress Notes (Signed)
Error

## 2020-11-08 DIAGNOSIS — G4733 Obstructive sleep apnea (adult) (pediatric): Secondary | ICD-10-CM | POA: Diagnosis not present

## 2020-11-30 DIAGNOSIS — G4733 Obstructive sleep apnea (adult) (pediatric): Secondary | ICD-10-CM | POA: Diagnosis not present

## 2020-12-14 DIAGNOSIS — D229 Melanocytic nevi, unspecified: Secondary | ICD-10-CM | POA: Diagnosis not present

## 2020-12-14 DIAGNOSIS — L853 Xerosis cutis: Secondary | ICD-10-CM | POA: Diagnosis not present

## 2020-12-14 DIAGNOSIS — L814 Other melanin hyperpigmentation: Secondary | ICD-10-CM | POA: Diagnosis not present

## 2020-12-14 DIAGNOSIS — L821 Other seborrheic keratosis: Secondary | ICD-10-CM | POA: Diagnosis not present

## 2020-12-22 ENCOUNTER — Other Ambulatory Visit: Payer: Self-pay | Admitting: Family Medicine

## 2021-01-22 ENCOUNTER — Telehealth: Payer: Self-pay | Admitting: Family Medicine

## 2021-01-22 NOTE — Telephone Encounter (Signed)
Needs visit in April. Please send back after appt is made

## 2021-01-22 NOTE — Telephone Encounter (Signed)
Sent my chart message to schedule appointment.

## 2021-01-23 MED ORDER — BUPROPION HCL ER (XL) 300 MG PO TB24: 300.0000 mg | ORAL_TABLET | Freq: Every day | ORAL | 0 refills | Status: DC

## 2021-01-23 NOTE — Telephone Encounter (Signed)
Please advise. Thank you

## 2021-01-23 NOTE — Telephone Encounter (Signed)
Patient schedule follow up for 4/6

## 2021-01-23 NOTE — Addendum Note (Signed)
Addended by: Annalee Genta on: 01/23/2021 02:28 PM   Modules accepted: Orders

## 2021-02-07 ENCOUNTER — Ambulatory Visit (INDEPENDENT_AMBULATORY_CARE_PROVIDER_SITE_OTHER): Payer: BC Managed Care – PPO | Admitting: Family Medicine

## 2021-02-07 ENCOUNTER — Other Ambulatory Visit: Payer: Self-pay

## 2021-02-07 VITALS — BP 138/82 | HR 69 | Temp 97.5°F | Ht 71.0 in

## 2021-02-07 DIAGNOSIS — F411 Generalized anxiety disorder: Secondary | ICD-10-CM

## 2021-02-07 DIAGNOSIS — I10 Essential (primary) hypertension: Secondary | ICD-10-CM

## 2021-02-07 DIAGNOSIS — R7989 Other specified abnormal findings of blood chemistry: Secondary | ICD-10-CM | POA: Diagnosis not present

## 2021-02-07 DIAGNOSIS — F339 Major depressive disorder, recurrent, unspecified: Secondary | ICD-10-CM | POA: Diagnosis not present

## 2021-02-07 MED ORDER — BUPROPION HCL ER (XL) 300 MG PO TB24: 300.0000 mg | ORAL_TABLET | Freq: Every day | ORAL | 1 refills | Status: DC

## 2021-02-07 MED ORDER — AMLODIPINE BESYLATE 10 MG PO TABS: ORAL_TABLET | ORAL | 1 refills | Status: DC

## 2021-02-07 NOTE — Progress Notes (Signed)
Patient ID: Stephen [name], male    DOB: 11/19/1960, 60 y.o.   MRN: [id number]   Chief Complaint  Patient presents with  . Hypertension    Follow up and refills  . Depression    Medication refill   Subjective:    HPI  HTN Pt compliant with BP meds.  No SEs Denies chest pain, sob, LE swelling, or blurry vision.   At home was 118/78.  bp running well. Taking norvasc.   Depression/anxiety- stable.  Doing well with wellbutrin.  Elevated cr in past, will recheck.   Medical History Stephen [name] has no past medical history on file.   Outpatient Encounter Medications as of 02/07/2021  Medication Sig  . Multiple Vitamin (MULTIVITAMIN) tablet Take 1 tablet by mouth daily.  Marland Kitchen OVER THE COUNTER MEDICATION Vit D once daily  . vardenafil (LEVITRA) 20 MG tablet Take 1 tablet (20 mg total) by mouth daily as needed for erectile dysfunction.  . [DISCONTINUED] amLODipine (NORVASC) 10 MG tablet TAKE (1) TABLET BY MOUTH ONCE DAILY.  . [DISCONTINUED] buPROPion (WELLBUTRIN XL) 300 MG 24 hr tablet Take 1 tablet (300 mg total) by mouth daily.  Melene Muller ON 03/04/2021] amLODipine (NORVASC) 10 MG tablet TAKE (1) TABLET BY MOUTH ONCE DAILY.  Melene Muller ON 03/04/2021] buPROPion (WELLBUTRIN XL) 300 MG 24 hr tablet Take 1 tablet (300 mg total) by mouth daily.  . [DISCONTINUED] diclofenac Sodium (VOLTAREN) 1 % GEL Apply 2 g topically 4 (four) times daily. Apply to shoulder for 2 wks. (Patient not taking: Reported on 08/08/2020)   No facility-administered encounter medications on file as of 02/07/2021.     Review of Systems  Constitutional: Negative for chills and fever.  HENT: Negative for congestion, rhinorrhea and sore throat.   Respiratory: Negative for cough, shortness of breath and wheezing.   Cardiovascular: Negative for chest pain and leg swelling.  Gastrointestinal: Negative for abdominal pain, diarrhea, nausea and vomiting.  Genitourinary: Negative for dysuria and frequency.  Skin: Negative for  rash.  Neurological: Negative for dizziness, weakness and headaches.     Vitals BP 138/82   Pulse 69   Temp (!) 97.5 F (36.4 C)   Ht 5\' 11"  (1.803 m)   SpO2 98%   BMI 28.51 kg/m   Objective:   Physical Exam Vitals and nursing note reviewed.  Constitutional:      General: He is not in acute distress.    Appearance: Normal appearance. He is not ill-appearing.  HENT:     Head: Normocephalic.     Nose: Nose normal. No congestion.     Mouth/Throat:     Mouth: Mucous membranes are moist.     Pharynx: No oropharyngeal exudate.  Eyes:     Extraocular Movements: Extraocular movements intact.     Conjunctiva/sclera: Conjunctivae normal.     Pupils: Pupils are equal, round, and reactive to light.  Cardiovascular:     Rate and Rhythm: Normal rate and regular rhythm.     Pulses: Normal pulses.     Heart sounds: Normal heart sounds. No murmur heard.   Pulmonary:     Effort: Pulmonary effort is normal.     Breath sounds: Normal breath sounds. No wheezing, rhonchi or rales.  Musculoskeletal:        General: Normal range of motion.     Right lower leg: No edema.     Left lower leg: No edema.  Skin:    General: Skin is warm and dry.  Findings: No rash.  Neurological:     General: No focal deficit present.     Mental Status: He is alert and oriented to person, place, and time.     Cranial Nerves: No cranial nerve deficit.  Psychiatric:        Mood and Affect: Mood normal.        Behavior: Behavior normal.        Thought Content: Thought content normal.        Judgment: Judgment normal.      Assessment and Plan   1. Hypertension, unspecified type - amLODipine (NORVASC) 10 MG tablet; TAKE (1) TABLET BY MOUTH ONCE DAILY.  Dispense: 90 tablet; Refill: 1  2. Elevated serum creatinine - Basic metabolic panel  3. Depression, recurrent ([facility]) - buPROPion (WELLBUTRIN XL) 300 MG 24 hr tablet; Take 1 tablet (300 mg total) by mouth daily.  Dispense: 90 tablet; Refill:  1  4. Generalized anxiety disorder - buPROPion (WELLBUTRIN XL) 300 MG 24 hr tablet; Take 1 tablet (300 mg total) by mouth daily.  Dispense: 90 tablet; Refill: 1    htn- suboptimal.  Cont to monitor.  Dec salt in diet.  Cont meds.  Elevated Cr- will recheck. Last visit was 1.32.  Depression/anxiety- stable. Cont meds.  Return in about 6 months (around 08/09/2021) for f/u htn, depression.   02/27/2021

## 2021-02-08 LAB — BASIC METABOLIC PANEL
BUN/Creatinine Ratio: 8 — ABNORMAL LOW (ref 9–20)
BUN: 11 mg/dL (ref 6–24)
CO2: 20 mmol/L (ref 20–29)
Calcium: 9.7 mg/dL (ref 8.7–10.2)
Chloride: 104 mmol/L (ref 96–106)
Creatinine, Ser: 1.31 mg/dL — ABNORMAL HIGH (ref 0.76–1.27)
Glucose: 84 mg/dL (ref 65–99)
Potassium: 5 mmol/L (ref 3.5–5.2)
Sodium: 142 mmol/L (ref 134–144)
eGFR: 63 mL/min/{1.73_m2} (ref >59)

## 2021-02-27 ENCOUNTER — Encounter: Payer: Self-pay | Admitting: Family Medicine

## 2021-03-19 NOTE — Telephone Encounter (Signed)
Nurses Please go ahead with referral to [facility] GI doctor [facility] Notify patient. GI office should reach out to the patient to have this scheduled

## 2021-03-20 ENCOUNTER — Other Ambulatory Visit: Payer: Self-pay | Admitting: *Deleted

## 2021-03-20 DIAGNOSIS — Z1211 Encounter for screening for malignant neoplasm of colon: Secondary | ICD-10-CM

## 2021-04-03 NOTE — Telephone Encounter (Signed)
Covid 19 recommendations-  It's symptomatic tx for viral illness. Take mucinex as directed, could change to delsym if needed.  Increase fluid intake.  Vit C, vit D and zinc are recommended vitamins to take.   zinc dose of 75 mg-100mg .  daily doses of vitamin D3 (1,000-4,000 IU), vitamin C (500 mg), and melatonin (0.3mg -2 mg each night).  Flonase for nasal congestion.  Use tylenol or ibuprofen as needed for fever or pain.   If not getting better with all this, then need to go to urgent care if after hours for further evaluation and check of vitals.   If needed we can call in the paxlovid medication, but will need a discussion of the side effects.   Take care,   Dr. Ladona Ridgel

## 2021-05-01 ENCOUNTER — Emergency Department (HOSPITAL_COMMUNITY): Payer: BC Managed Care – PPO

## 2021-05-01 ENCOUNTER — Emergency Department (HOSPITAL_COMMUNITY)
Admission: EM | Admit: 2021-05-01 | Discharge: 2021-05-01 | Disposition: A | Discharge status: Home / Self Care | Payer: BC Managed Care – PPO | Attending: Emergency Medicine | Admitting: Emergency Medicine

## 2021-05-01 ENCOUNTER — Other Ambulatory Visit: Payer: Self-pay

## 2021-05-01 ENCOUNTER — Encounter (HOSPITAL_COMMUNITY): Payer: Self-pay | Admitting: *Deleted

## 2021-05-01 DIAGNOSIS — I10 Essential (primary) hypertension: Secondary | ICD-10-CM | POA: Insufficient documentation

## 2021-05-01 DIAGNOSIS — Z8616 Personal history of COVID-19: Secondary | ICD-10-CM | POA: Insufficient documentation

## 2021-05-01 DIAGNOSIS — H538 Other visual disturbances: Secondary | ICD-10-CM | POA: Insufficient documentation

## 2021-05-01 DIAGNOSIS — Z79899 Other long term (current) drug therapy: Secondary | ICD-10-CM | POA: Insufficient documentation

## 2021-05-01 DIAGNOSIS — R531 Weakness: Secondary | ICD-10-CM

## 2021-05-01 DIAGNOSIS — M79606 Pain in leg, unspecified: Secondary | ICD-10-CM | POA: Diagnosis not present

## 2021-05-01 HISTORY — DX: Essential (primary) hypertension: I10

## 2021-05-01 HISTORY — DX: Depression, unspecified: F32.A

## 2021-05-01 LAB — COMPREHENSIVE METABOLIC PANEL
ALT: 22 U/L (ref 0–44)
AST: 20 U/L (ref 15–41)
Albumin: 4.2 g/dL (ref 3.5–5.0)
Alkaline Phosphatase: 63 U/L (ref 38–126)
Anion gap: 8 (ref 5–15)
BUN: 13 mg/dL (ref 6–20)
CO2: 24 mmol/L (ref 22–32)
Calcium: 8.9 mg/dL (ref 8.9–10.3)
Chloride: 108 mmol/L (ref 98–111)
Creatinine, Ser: 1.32 mg/dL — ABNORMAL HIGH (ref 0.61–1.24)
GFR, Estimated: >60 mL/min (ref >60)
Glucose, Bld: 99 mg/dL (ref 70–99)
Potassium: 3.9 mmol/L (ref 3.5–5.1)
Sodium: 140 mmol/L (ref 135–145)
Total Bilirubin: 0.5 mg/dL (ref 0.3–1.2)
Total Protein: 7.1 g/dL (ref 6.5–8.1)

## 2021-05-01 LAB — CBC WITH DIFFERENTIAL/PLATELET
Abs Immature Granulocytes: 0.02 10*3/uL (ref 0.00–0.07)
Basophils Absolute: 0 10*3/uL (ref 0.0–0.1)
Basophils Relative: 1 %
Eosinophils Absolute: 0.2 10*3/uL (ref 0.0–0.5)
Eosinophils Relative: 3 %
HCT: 43.3 % (ref 39.0–52.0)
Hemoglobin: 14.4 g/dL (ref 13.0–17.0)
Immature Granulocytes: 0 %
Lymphocytes Relative: 20 %
Lymphs Abs: 1.3 10*3/uL (ref 0.7–4.0)
MCH: 31.6 pg (ref 26.0–34.0)
MCHC: 33.3 g/dL (ref 30.0–36.0)
MCV: 95.2 fL (ref 80.0–100.0)
Monocytes Absolute: 0.4 10*3/uL (ref 0.1–1.0)
Monocytes Relative: 7 %
Neutro Abs: 4.4 10*3/uL (ref 1.7–7.7)
Neutrophils Relative %: 69 %
Platelets: 247 10*3/uL (ref 150–400)
RBC: 4.55 MIL/uL (ref 4.22–5.81)
RDW: 12.7 % (ref 11.5–15.5)
WBC: 6.3 10*3/uL (ref 4.0–10.5)
nRBC: 0 % (ref 0.0–0.2)

## 2021-05-01 MED ORDER — SODIUM CHLORIDE 0.9 % IV BOLUS
500.0000 mL | Freq: Once | INTRAVENOUS | Status: AC
  Administered 2021-05-01: 500 mL via INTRAVENOUS

## 2021-05-01 NOTE — ED Provider Notes (Signed)
[facility] EMERGENCY DEPARTMENT Provider Note   CSN: [id number] Arrival date & time: 05/01/21  1413     History No chief complaint on file.   Stephen [name] is a 60 y.o. male.  Patient states he had COVID a month ago.  He is complaining of blurred vision and weakness  The history is provided by the patient and medical records. No language interpreter was used.  Weakness Severity:  Mild Onset quality:  Sudden Timing:  Constant Progression:  Worsening Chronicity:  New Context: not alcohol use   Relieved by:  Nothing Worsened by:  Nothing Ineffective treatments:  None tried Associated symptoms: no abdominal pain, no chest pain, no cough, no diarrhea, no frequency, no headaches and no seizures       Past Medical History:  Diagnosis Date   Depression    Hypertension     Patient Active Problem List   Diagnosis Date Noted   Elevated serum creatinine 08/25/2020   Generalized anxiety disorder 03/17/2018   Depression, recurrent (H[facility]) 03/17/2018    Past Surgical History:  Procedure Laterality Date   KNEE SURGERY         Family History  Problem Relation Age of Onset   Healthy Mother    Healthy Father     Social History   Tobacco Use   Smoking status: Never   Smokeless tobacco: Never  Substance Use Topics   Alcohol use: Yes   Drug use: Never    Home Medications Prior to Admission medications   Medication Sig Start Date End Date Taking? Authorizing Provider  amLODipine (NORVASC) 10 MG tablet TAKE (1) TABLET BY MOUTH ONCE DAILY. 03/04/21   Ladona Ridgel, Malena M, DO  buPROPion (WELLBUTRIN XL) 300 MG 24 hr tablet Take 1 tablet (300 mg total) by mouth daily. 03/04/21   Annalee Genta, DO  Multiple Vitamin (MULTIVITAMIN) tablet Take 1 tablet by mouth daily.    [provider]  OVER THE COUNTER MEDICATION Vit D once daily    [provider]  vardenafil (LEVITRA) 20 MG tablet Take 1 tablet (20 mg total) by mouth daily as needed for erectile  dysfunction. 04/28/20   Annalee Genta, DO    Allergies    Patient has no known allergies.  Review of Systems   Review of Systems  Constitutional:  Negative for appetite change and fatigue.  HENT:  Negative for congestion, ear discharge and sinus pressure.        Blurred vision  Eyes:  Negative for discharge.  Respiratory:  Negative for cough.   Cardiovascular:  Negative for chest pain.  Gastrointestinal:  Negative for abdominal pain and diarrhea.  Genitourinary:  Negative for frequency and hematuria.  Musculoskeletal:  Negative for back pain.  Skin:  Negative for rash.  Neurological:  Positive for weakness. Negative for seizures and headaches.  Psychiatric/Behavioral:  Negative for hallucinations.    Physical Exam Updated Vital Signs BP 130/81   Pulse 64   Temp 98.3 F (36.8 C) (Oral)   Resp (!) 27   Ht 5\' 10"  (1.778 m)   Wt 93 kg   SpO2 99%   BMI 29.41 kg/m   Physical Exam Vitals and nursing note reviewed.  Constitutional:      Appearance: He is well-developed.  HENT:     Head: Normocephalic.  Eyes:     General: No scleral icterus.    Conjunctiva/sclera: Conjunctivae normal.  Neck:     Thyroid: No thyromegaly.  Cardiovascular:     Rate  and Rhythm: Normal rate and regular rhythm.     Heart sounds: No murmur heard.   No friction rub. No gallop.  Pulmonary:     Breath sounds: No stridor. No wheezing or rales.  Chest:     Chest wall: No tenderness.  Abdominal:     General: There is no distension.     Tenderness: There is no abdominal tenderness. There is no rebound.  Musculoskeletal:        General: Normal range of motion.     Cervical back: Neck supple.  Lymphadenopathy:     Cervical: No cervical adenopathy.  Skin:    Findings: No erythema or rash.  Neurological:     Mental Status: He is alert and oriented to person, place, and time.     Motor: No abnormal muscle tone.     Coordination: Coordination normal.  Psychiatric:        Behavior: Behavior  normal.    ED Results / Procedures / Treatments   Labs (all labs ordered are listed, but only abnormal results are displayed) Labs Reviewed  COMPREHENSIVE METABOLIC PANEL - Abnormal; Notable for the following components:      Result Value   Creatinine, Ser 1.32 (*)    All other components within normal limits  CBC WITH DIFFERENTIAL/PLATELET    EKG EKG Interpretation  Date/Time:  Tuesday May 01 2021 15:59:45 EDT Ventricular Rate:  72 PR Interval:  203 QRS Duration: 106 QT Interval:  396 QTC Calculation: 434 R Axis:   41 Text Interpretation: Sinus rhythm Borderline prolonged PR interval Low voltage, precordial leads Confirmed by Bethann Berkshire [phone]) on 05/01/2021 5:41:04 PM  Radiology CT Head Wo Contrast  Result Date: 05/01/2021 CLINICAL DATA:  Leg pain. Balance disturbance. Blurred vision. Recent coronavirus infection. EXAM: CT HEAD WITHOUT CONTRAST TECHNIQUE: Contiguous axial images were obtained from the base of the skull through the vertex without intravenous contrast. COMPARISON:  None. FINDINGS: Brain: The brain shows a normal appearance without evidence of malformation, atrophy, old or acute small or large vessel infarction, mass lesion, hemorrhage, hydrocephalus or extra-axial collection. Vascular: No hyperdense vessel. No evidence of atherosclerotic calcification. Skull: Normal.  No traumatic finding.  No focal bone lesion. Sinuses/Orbits: Sinuses are clear. Orbits appear normal. Mastoids are clear. Other: None significant IMPRESSION: Normal head CT. Electronically Signed   By: Paulina Fusi M.D.   On: 05/01/2021 15:54   DG Chest Port 1 View  Result Date: 05/01/2021 CLINICAL DATA:  Weakness. EXAM: PORTABLE CHEST 1 VIEW COMPARISON:  No recent prior. FINDINGS: Mediastinum and hilar structures normal. Heart size normal. No focal infiltrate. No pleural effusion or pneumothorax. Degenerative changes and scoliosis thoracic spine. IMPRESSION: No acute cardiopulmonary disease.  Electronically Signed   By: Maisie Fus  Register   On: 05/01/2021 15:42    Procedures Procedures   Medications Ordered in ED Medications  sodium chloride 0.9 % bolus 500 mL (500 mLs Intravenous New Bag/Given 05/01/21 1625)  sodium chloride 0.9 % bolus 500 mL (500 mLs Intravenous New Bag/Given 05/01/21 1619)    ED Course  I have reviewed the triage vital signs and the nursing notes.  Pertinent labs & imaging results that were available during my care of the patient were reviewed by me and considered in my medical decision making (see chart for details).    MDM Rules/Calculators/A&P                          Patient with blurred vision but  visual acuity was normal.  CT head was negative labs unremarkable.  Patient with COVID a month ago.  He will follow-up with his PCP Final Clinical Impression(s) / ED Diagnoses Final diagnoses:  None    Rx / DC Orders ED Discharge Orders     None        Bethann Berkshire, MD 05/04/21 1140

## 2021-05-01 NOTE — Discharge Instructions (Addendum)
Drink plenty of fluids and follow-up with your family doctor in 2 to 3 days for recheck.

## 2021-05-01 NOTE — ED Triage Notes (Signed)
States he ad covid last month, c/o leg pain , off balance, blurred vision and concerned his veins are larger than normal.

## 2021-05-01 NOTE — Telephone Encounter (Signed)
Pt scheduled for tomorrow morning 

## 2021-05-02 ENCOUNTER — Ambulatory Visit (INDEPENDENT_AMBULATORY_CARE_PROVIDER_SITE_OTHER): Payer: BC Managed Care – PPO | Admitting: Family Medicine

## 2021-05-02 ENCOUNTER — Encounter: Payer: Self-pay | Admitting: Family Medicine

## 2021-05-02 VITALS — BP 128/86 | HR 69 | Temp 98.2°F | Ht 70.0 in | Wt 206.0 lb

## 2021-05-02 DIAGNOSIS — F339 Major depressive disorder, recurrent, unspecified: Secondary | ICD-10-CM | POA: Diagnosis not present

## 2021-05-02 DIAGNOSIS — F411 Generalized anxiety disorder: Secondary | ICD-10-CM

## 2021-05-02 DIAGNOSIS — I10 Essential (primary) hypertension: Secondary | ICD-10-CM | POA: Diagnosis not present

## 2021-05-02 DIAGNOSIS — M79662 Pain in left lower leg: Secondary | ICD-10-CM

## 2021-05-02 DIAGNOSIS — M79661 Pain in right lower leg: Secondary | ICD-10-CM

## 2021-05-02 DIAGNOSIS — H538 Other visual disturbances: Secondary | ICD-10-CM

## 2021-05-02 MED ORDER — BUPROPION HCL ER (XL) 300 MG PO TB24: 300.0000 mg | ORAL_TABLET | Freq: Every day | ORAL | 1 refills | Status: DC

## 2021-05-02 MED ORDER — AMLODIPINE BESYLATE 10 MG PO TABS: ORAL_TABLET | ORAL | 1 refills | Status: DC

## 2021-05-02 NOTE — Progress Notes (Signed)
Patient ID: Stephen [name], male    DOB: 09/04/1961, 60 y.o.   MRN: [id number]   Chief Complaint  Patient presents with   Leg Pain    Vein concern    Subjective:    HPI Stands all day at work and was having bilateral leg pain. Pt states it felt like it was his veins and looked and it seemed like veins were sticking out more. Hurt to stand so laid down most of the day yesterday and kept legs elevated. Was getting worried and went to ED yesterday because vision got blurry, lasted 1-2 hrs.  Feeling better today.   Had covid 1 mo ago and lingering cough, and cough resolved.     After standing noticing veins in legs popping out.  Felt veins larger than normal in the lower legs.  Friend last fall had vein issues in the lower legs.  Yesterday at lunch started getting blurry, just had eye exam and new glasses.  Then got worried and went to ER. Wondered if having a stroke.  Evaluation was normal.  Slight elevated in Cr at 1.32.   Medical History Stephen [name] has a past medical history of Depression and Hypertension.   Outpatient Encounter Medications as of 05/02/2021  Medication Sig   Multiple Vitamin (MULTIVITAMIN) tablet Take 1 tablet by mouth daily.   OVER THE COUNTER MEDICATION Vit D once daily   vardenafil (LEVITRA) 20 MG tablet Take 1 tablet (20 mg total) by mouth daily as needed for erectile dysfunction.   [DISCONTINUED] amLODipine (NORVASC) 10 MG tablet TAKE (1) TABLET BY MOUTH ONCE DAILY.   [DISCONTINUED] buPROPion (WELLBUTRIN XL) 300 MG 24 hr tablet Take 1 tablet (300 mg total) by mouth daily.   buPROPion (WELLBUTRIN XL) 300 MG 24 hr tablet Take 1 tablet (300 mg total) by mouth daily.   [DISCONTINUED] amLODipine (NORVASC) 10 MG tablet TAKE (1) TABLET BY MOUTH ONCE DAILY.   No facility-administered encounter medications on file as of 05/02/2021.     Review of Systems  Constitutional:  Negative for chills and fever.  HENT:  Negative for congestion, rhinorrhea and sore  throat.   Eyes:  Positive for visual disturbance (resolved).  Respiratory:  Negative for cough, shortness of breath and wheezing.   Cardiovascular:  Negative for chest pain and leg swelling.  Gastrointestinal:  Negative for abdominal pain, diarrhea, nausea and vomiting.  Genitourinary:  Negative for dysuria and frequency.  Musculoskeletal:  Positive for arthralgias (lower leg pain/vein concern).  Skin:  Negative for rash.  Neurological:  Negative for dizziness, weakness and headaches.    Vitals BP 128/86   Pulse 69   Temp 98.2 F (36.8 C)   Ht 5\' 10"  (1.778 m)   Wt 206 lb (93.4 kg)   SpO2 99%   BMI 29.56 kg/m   Objective:   Physical Exam Vitals and nursing note reviewed.  Constitutional:      General: He is not in acute distress.    Appearance: Normal appearance. He is not ill-appearing.  HENT:     Head: Normocephalic.     Nose: Nose normal. No congestion.     Mouth/Throat:     Mouth: Mucous membranes are moist.     Pharynx: No oropharyngeal exudate.  Eyes:     Extraocular Movements: Extraocular movements intact.     Conjunctiva/sclera: Conjunctivae normal.     Pupils: Pupils are equal, round, and reactive to light.  Cardiovascular:     Rate and Rhythm: Normal rate and regular  rhythm.     Pulses: Normal pulses.     Heart sounds: Normal heart sounds. No murmur heard. Pulmonary:     Effort: Pulmonary effort is normal.     Breath sounds: Normal breath sounds. No wheezing, rhonchi or rales.  Musculoskeletal:        General: Normal range of motion.     Right lower leg: No edema.     Left lower leg: No edema.     Comments: Veins- not distended. Not painful.  No ecchymosis.  No pitting edema over lower leg or ankles. No varicose veins.  Skin:    General: Skin is warm and dry.     Findings: No rash.  Neurological:     General: No focal deficit present.     Mental Status: He is alert and oriented to person, place, and time.     Cranial Nerves: No cranial nerve  deficit.  Psychiatric:        Mood and Affect: Mood normal.        Behavior: Behavior normal.        Thought Content: Thought content normal.        Judgment: Judgment normal.     Assessment and Plan   1. Pain in both lower legs  2. Hypertension, unspecified type  3. Depression, recurrent (HCC) - buPROPion (WELLBUTRIN XL) 300 MG 24 hr tablet; Take 1 tablet (300 mg total) by mouth daily.  Dispense: 90 tablet; Refill: 1  4. Generalized anxiety disorder - buPROPion (WELLBUTRIN XL) 300 MG 24 hr tablet; Take 1 tablet (300 mg total) by mouth daily.  Dispense: 90 tablet; Refill: 1  5. Blurry vision   Blurry vision resolved. Pt was seen and evaluated in ER.  Stable and normal findings.  Call if having worsening symptoms.  Cva ruled out, cont to monitor.  Htn- stable. Cont meds. Pt on norvasc and may make lower legs with inc swelling at times.  Cont to monitor.  Depression/anxiety- stable. Cont meds.  Lower leg pain/swelling veins- cont to elevate legs, compression stockings and may need to change bp med if continues to bother him.  No sign of pitting edema.  Return in about 6 months (around 11/01/2021) for anxiety/depression, htn.   05/20/2021

## 2021-05-15 ENCOUNTER — Other Ambulatory Visit: Payer: Self-pay | Admitting: Family Medicine

## 2021-05-15 DIAGNOSIS — I10 Essential (primary) hypertension: Secondary | ICD-10-CM

## 2021-05-15 NOTE — Progress Notes (Signed)
Will hold off on norvasc 10mg , was seeing numbers 112/70's in mornings and feeling dizzy.  Will recheck off the medication for the next week and call with numbers.   Dr. 

## 2021-06-19 ENCOUNTER — Other Ambulatory Visit: Payer: Self-pay

## 2021-06-19 ENCOUNTER — Emergency Department (HOSPITAL_COMMUNITY): Payer: BC Managed Care – PPO

## 2021-06-19 ENCOUNTER — Observation Stay (HOSPITAL_COMMUNITY)
Admission: EM | Admit: 2021-06-19 | Discharge: 2021-06-20 | Disposition: A | Discharge status: Home / Self Care | Payer: BC Managed Care – PPO | Attending: Family Medicine | Admitting: Family Medicine

## 2021-06-19 ENCOUNTER — Encounter (HOSPITAL_COMMUNITY): Payer: Self-pay | Admitting: Emergency Medicine

## 2021-06-19 DIAGNOSIS — R42 Dizziness and giddiness: Secondary | ICD-10-CM

## 2021-06-19 DIAGNOSIS — Z79899 Other long term (current) drug therapy: Secondary | ICD-10-CM | POA: Diagnosis not present

## 2021-06-19 DIAGNOSIS — R200 Anesthesia of skin: Secondary | ICD-10-CM | POA: Diagnosis not present

## 2021-06-19 DIAGNOSIS — Z20822 Contact with and (suspected) exposure to covid-19: Secondary | ICD-10-CM | POA: Diagnosis not present

## 2021-06-19 DIAGNOSIS — G4733 Obstructive sleep apnea (adult) (pediatric): Secondary | ICD-10-CM | POA: Diagnosis not present

## 2021-06-19 DIAGNOSIS — I10 Essential (primary) hypertension: Secondary | ICD-10-CM | POA: Insufficient documentation

## 2021-06-19 DIAGNOSIS — Y900 Blood alcohol level of less than 20 mg/100 ml: Secondary | ICD-10-CM | POA: Diagnosis not present

## 2021-06-19 DIAGNOSIS — R531 Weakness: Secondary | ICD-10-CM | POA: Diagnosis not present

## 2021-06-19 DIAGNOSIS — R410 Disorientation, unspecified: Secondary | ICD-10-CM | POA: Diagnosis not present

## 2021-06-19 DIAGNOSIS — R202 Paresthesia of skin: Secondary | ICD-10-CM | POA: Diagnosis not present

## 2021-06-19 DIAGNOSIS — F411 Generalized anxiety disorder: Secondary | ICD-10-CM | POA: Diagnosis present

## 2021-06-19 DIAGNOSIS — G459 Transient cerebral ischemic attack, unspecified: Principal | ICD-10-CM | POA: Diagnosis present

## 2021-06-19 DIAGNOSIS — R55 Syncope and collapse: Secondary | ICD-10-CM | POA: Insufficient documentation

## 2021-06-19 LAB — I-STAT CHEM 8, ED
BUN: 11 mg/dL (ref 6–20)
Calcium, Ion: 1.18 mmol/L (ref 1.15–1.40)
Chloride: 104 mmol/L (ref 98–111)
Creatinine, Ser: 1.5 mg/dL — ABNORMAL HIGH (ref 0.61–1.24)
Glucose, Bld: 95 mg/dL (ref 70–99)
HCT: 42 % (ref 39.0–52.0)
Hemoglobin: 14.3 g/dL (ref 13.0–17.0)
Potassium: 4.2 mmol/L (ref 3.5–5.1)
Sodium: 141 mmol/L (ref 135–145)
TCO2: 28 mmol/L (ref 22–32)

## 2021-06-19 LAB — DIFFERENTIAL
Abs Immature Granulocytes: 0.02 10*3/uL (ref 0.00–0.07)
Basophils Absolute: 0 10*3/uL (ref 0.0–0.1)
Basophils Relative: 1 %
Eosinophils Absolute: 0.2 10*3/uL (ref 0.0–0.5)
Eosinophils Relative: 3 %
Immature Granulocytes: 0 %
Lymphocytes Relative: 23 %
Lymphs Abs: 1.5 10*3/uL (ref 0.7–4.0)
Monocytes Absolute: 0.4 10*3/uL (ref 0.1–1.0)
Monocytes Relative: 7 %
Neutro Abs: 4.3 10*3/uL (ref 1.7–7.7)
Neutrophils Relative %: 66 %

## 2021-06-19 LAB — URINALYSIS, ROUTINE W REFLEX MICROSCOPIC
Bacteria, UA: NONE SEEN
Bilirubin Urine: NEGATIVE
Glucose, UA: NEGATIVE mg/dL
Ketones, ur: NEGATIVE mg/dL
Leukocytes,Ua: NEGATIVE
Nitrite: NEGATIVE
Protein, ur: NEGATIVE mg/dL
Specific Gravity, Urine: 1.006 (ref 1.005–1.030)
pH: 7 (ref 5.0–8.0)

## 2021-06-19 LAB — CBG MONITORING, ED: Glucose-Capillary: 84 mg/dL (ref 70–99)

## 2021-06-19 LAB — RESP PANEL BY RT-PCR (FLU A&B, COVID) ARPGX2
Influenza A by PCR: NEGATIVE
Influenza B by PCR: NEGATIVE
SARS Coronavirus 2 by RT PCR: NEGATIVE

## 2021-06-19 LAB — COMPREHENSIVE METABOLIC PANEL
ALT: 23 U/L (ref 0–44)
AST: 20 U/L (ref 15–41)
Albumin: 4.5 g/dL (ref 3.5–5.0)
Alkaline Phosphatase: 67 U/L (ref 38–126)
Anion gap: 6 (ref 5–15)
BUN: 12 mg/dL (ref 6–20)
CO2: 26 mmol/L (ref 22–32)
Calcium: 9.1 mg/dL (ref 8.9–10.3)
Chloride: 106 mmol/L (ref 98–111)
Creatinine, Ser: 1.38 mg/dL — ABNORMAL HIGH (ref 0.61–1.24)
GFR, Estimated: 59 mL/min — ABNORMAL LOW (ref >60)
Glucose, Bld: 95 mg/dL (ref 70–99)
Potassium: 4 mmol/L (ref 3.5–5.1)
Sodium: 138 mmol/L (ref 135–145)
Total Bilirubin: 0.6 mg/dL (ref 0.3–1.2)
Total Protein: 7.8 g/dL (ref 6.5–8.1)

## 2021-06-19 LAB — TROPONIN I (HIGH SENSITIVITY)
Troponin I (High Sensitivity): <2 ng/L (ref <18)
Troponin I (High Sensitivity): 2 ng/L (ref <18)

## 2021-06-19 LAB — CBC
HCT: 43.5 % (ref 39.0–52.0)
Hemoglobin: 14.5 g/dL (ref 13.0–17.0)
MCH: 32.1 pg (ref 26.0–34.0)
MCHC: 33.3 g/dL (ref 30.0–36.0)
MCV: 96.2 fL (ref 80.0–100.0)
Platelets: 234 10*3/uL (ref 150–400)
RBC: 4.52 MIL/uL (ref 4.22–5.81)
RDW: 12.8 % (ref 11.5–15.5)
WBC: 6.5 10*3/uL (ref 4.0–10.5)
nRBC: 0 % (ref 0.0–0.2)

## 2021-06-19 LAB — HEMOGLOBIN A1C
Hgb A1c MFr Bld: 5.3 % (ref 4.8–5.6)
Mean Plasma Glucose: 105.41 mg/dL

## 2021-06-19 LAB — RAPID URINE DRUG SCREEN, HOSP PERFORMED
Amphetamines: NOT DETECTED
Barbiturates: NOT DETECTED
Benzodiazepines: NOT DETECTED
Cocaine: NOT DETECTED
Opiates: NOT DETECTED
Tetrahydrocannabinol: NOT DETECTED

## 2021-06-19 LAB — APTT: aPTT: 32 seconds (ref 24–36)

## 2021-06-19 LAB — TSH: TSH: 1.093 u[IU]/mL (ref 0.350–4.500)

## 2021-06-19 LAB — PROTIME-INR
INR: 1 (ref 0.8–1.2)
Prothrombin Time: 12.7 seconds (ref 11.4–15.2)

## 2021-06-19 LAB — ETHANOL: Alcohol, Ethyl (B): <10 mg/dL (ref <10)

## 2021-06-19 MED ORDER — STROKE: EARLY STAGES OF RECOVERY BOOK: Freq: Once | Status: AC

## 2021-06-19 MED ORDER — IBUPROFEN 400 MG PO TABS: 400.0000 mg | ORAL_TABLET | Freq: Four times a day (QID) | ORAL | Status: DC | PRN

## 2021-06-19 MED ORDER — ONDANSETRON HCL 4 MG/2ML IJ SOLN: 4.0000 mg | Freq: Four times a day (QID) | INTRAMUSCULAR | Status: DC | PRN

## 2021-06-19 MED ORDER — SODIUM CHLORIDE 0.9 % IV SOLN: 250.0000 mL | INTRAVENOUS | Status: DC | PRN

## 2021-06-19 MED ORDER — ASPIRIN EC 81 MG PO TBEC: 81.0000 mg | DELAYED_RELEASE_TABLET | Freq: Every day | ORAL | Status: DC

## 2021-06-19 MED ORDER — TRAZODONE HCL 50 MG PO TABS: 25.0000 mg | ORAL_TABLET | Freq: Every evening | ORAL | Status: DC | PRN

## 2021-06-19 MED ORDER — ASPIRIN 300 MG RE SUPP: 300.0000 mg | Freq: Every day | RECTAL | Status: DC

## 2021-06-19 MED ORDER — ASPIRIN 325 MG PO TABS
325.0000 mg | ORAL_TABLET | Freq: Every day | ORAL | Status: DC
  Administered 2021-06-19 – 2021-06-20 (×2): 325 mg via ORAL
  Filled 2021-06-19 (×2): qty 1

## 2021-06-19 MED ORDER — SODIUM CHLORIDE 0.9% FLUSH
3.0000 mL | Freq: Two times a day (BID) | INTRAVENOUS | Status: DC
  Administered 2021-06-20: 3 mL via INTRAVENOUS

## 2021-06-19 MED ORDER — VITAMIN D 25 MCG (1000 UNIT) PO TABS
1000.0000 [IU] | ORAL_TABLET | Freq: Every day | ORAL | Status: DC
  Administered 2021-06-19 – 2021-06-20 (×2): 1000 [IU] via ORAL
  Filled 2021-06-19 (×2): qty 1

## 2021-06-19 MED ORDER — BUPROPION HCL ER (XL) 300 MG PO TB24
300.0000 mg | ORAL_TABLET | Freq: Every day | ORAL | Status: DC
  Administered 2021-06-20: 300 mg via ORAL
  Filled 2021-06-19: qty 1

## 2021-06-19 MED ORDER — ONDANSETRON HCL 4 MG PO TABS: 4.0000 mg | ORAL_TABLET | Freq: Four times a day (QID) | ORAL | Status: DC | PRN

## 2021-06-19 MED ORDER — METOPROLOL TARTRATE 5 MG/5ML IV SOLN: 5.0000 mg | Freq: Four times a day (QID) | INTRAVENOUS | Status: DC | PRN

## 2021-06-19 MED ORDER — ENOXAPARIN SODIUM 40 MG/0.4ML IJ SOSY
40.0000 mg | PREFILLED_SYRINGE | INTRAMUSCULAR | Status: DC
  Administered 2021-06-19: 40 mg via SUBCUTANEOUS
  Filled 2021-06-19: qty 0.4

## 2021-06-19 MED ORDER — SODIUM CHLORIDE 0.9% FLUSH: 3.0000 mL | INTRAVENOUS | Status: DC | PRN

## 2021-06-19 MED ORDER — POLYETHYLENE GLYCOL 3350 17 G PO PACK: 17.0000 g | PACK | Freq: Every day | ORAL | Status: DC | PRN

## 2021-06-19 NOTE — H&P (Signed)
History and Physical    [name] QTM:[id number] DOB: 04/18/1961 DOA: 06/19/2021  PCP: Annalee Genta, DO  Patient coming from: Home   I have personally briefly reviewed patient's old medical records in [facility]  Chief Complaint: Dizziness  HPI: Stephen [name] is a 60 y.o. male with medical history significant of erectile dysfunction, anxiety and depression who was in his usual state of health until around 2 PM today.  He was talking on the phone and became lightheaded, dizzy, slightly confused.  Patient did take a Levitra this morning as well.  He reports some numbness and tingling in his left arm.  He has not really returned to his baseline and decided to come to the hospital.  He did take 4 aspirin at home. ED Course: In the ED his exam was noted to be nonfocal, imaging was negative.  Teleneurology recommended admission for TIA work-up.  Review of Systems: As per HPI otherwise 10 point review of systems negative.   Past Medical History:  Diagnosis Date   Depression    Hypertension     Past Surgical History:  Procedure Laterality Date   KNEE SURGERY       reports that he has never smoked. He has never used smokeless tobacco. He reports current alcohol use. He reports that he does not use drugs.  No Known Allergies  Family History  Problem Relation Age of Onset   Healthy Mother    Healthy Father     Prior to Admission medications   Medication Sig Start Date End Date Taking? Authorizing Provider  buPROPion (WELLBUTRIN XL) 300 MG 24 hr tablet Take 1 tablet (300 mg total) by mouth daily. 05/02/21  Yes Ladona Ridgel, Malena M, DO  Cholecalciferol 50 MCG (2000 UT) TABS Take 1 tablet by mouth daily.   Yes [provider]  Multiple Vitamin (MULTIVITAMIN) tablet Take 1 tablet by mouth daily.   Yes [provider]  oxymetazoline (AFRIN) 0.05 % nasal spray Place 2 sprays into both nostrils at bedtime.   Yes [provider]  vardenafil  (LEVITRA) 20 MG tablet Take 1 tablet (20 mg total) by mouth daily as needed for erectile dysfunction. Patient taking differently: Take 20 mg by mouth daily as needed for erectile dysfunction. Take 1/3 tablet daily as needed 04/28/20  Yes Taylor, Malena M, DO  buPROPion (WELLBUTRIN XL) 300 MG 24 hr tablet Take by mouth. Patient not taking: Reported on 06/19/2021 03/17/18   [provider]  OVER THE COUNTER MEDICATION Vit D once daily Patient not taking: Reported on 06/19/2021    [provider]    Physical Exam: Vitals:   06/19/21 1615 06/19/21 1630 06/19/21 1645 06/19/21 1700  BP: (!) 142/91 (!) 146/91 (!) 148/88 (!) 147/93  Pulse: 66 63 61 66  Resp: (!) 26 (!) 26 (!) 23 (!) 27  Temp:      TempSrc:      SpO2: 99% 100% 100% 100%  Weight:      Height:        Constitutional: NAD, calm, comfortable Vitals:   06/19/21 1615 06/19/21 1630 06/19/21 1645 06/19/21 1700  BP: (!) 142/91 (!) 146/91 (!) 148/88 (!) 147/93  Pulse: 66 63 61 66  Resp: (!) 26 (!) 26 (!) 23 (!) 27  Temp:      TempSrc:      SpO2: 99% 100% 100% 100%  Weight:      Height:       Eyes: PERRL, lids and  conjunctivae normal ENMT: Mucous membranes are moist. Posterior pharynx clear of any exudate or lesions.Normal dentition.  Neck: normal, supple, no masses, no thyromegaly Respiratory: clear to auscultation bilaterally, no wheezing, no crackles. Normal respiratory effort. No accessory muscle use.  Cardiovascular: Regular rate and rhythm, no murmurs / rubs / gallops. No extremity edema. 2+ pedal pulses. No carotid bruits.  Abdomen: no tenderness, no masses palpated. No hepatosplenomegaly. Bowel sounds positive.  Musculoskeletal: no clubbing / cyanosis. No joint deformity upper and lower extremities. Good ROM, no contractures. Normal muscle tone.  Skin: no rashes, lesions, ulcers. No induration Neurologic: CN 2-12 grossly intact. Sensation intact, DTR normal. Strength 5/5 in all 4.  Psychiatric: Normal  judgment and insight. Alert and oriented x 3. Normal mood.   Labs on Admission: I have personally reviewed following labs and imaging studies  CBC: Recent Labs  Lab 06/19/21 1517 06/19/21 1546  WBC 6.5  --   NEUTROABS 4.3  --   HGB 14.5 14.3  HCT 43.5 42.0  MCV 96.2  --   PLT 234  --    Basic Metabolic Panel: Recent Labs  Lab 06/19/21 1517 06/19/21 1546  NA 138 141  K 4.0 4.2  CL 106 104  CO2 26  --   GLUCOSE 95 95  BUN 12 11  CREATININE 1.38* 1.50*  CALCIUM 9.1  --    GFR: Estimated Creatinine Clearance: 60.8 mL/min (A) (by C-G formula based on SCr of 1.5 mg/dL (H)). Liver Function Tests: Recent Labs  Lab 06/19/21 1517  AST 20  ALT 23  ALKPHOS 67  BILITOT 0.6  PROT 7.8  ALBUMIN 4.5    Coagulation Profile: Recent Labs  Lab 06/19/21 1517  INR 1.0    CBG: Recent Labs  Lab 06/19/21 1508  GLUCAP 84     Radiological Exams on Admission: DG Chest Portable 1 View  Result Date: 06/19/2021 CLINICAL DATA:  Dizziness, weakness, confusion EXAM: PORTABLE CHEST 1 VIEW COMPARISON:  Chest radiograph 05/01/2021 FINDINGS: The cardiomediastinal silhouette is normal. The lungs are clear, with no focal consolidation or pulmonary edema. There is no pleural effusion or pneumothorax. There is no acute osseous abnormality. IMPRESSION: No radiographic evidence of acute cardiopulmonary process. Electronically Signed   By: Lesia Hausen M.D.   On: 06/19/2021 16:50   CT HEAD CODE STROKE WO CONTRAST  Result Date: 06/19/2021 CLINICAL DATA:  Code stroke. Transient ischemic attack. Dizziness and near syncopal episode, left-sided arm numbness. EXAM: CT HEAD WITHOUT CONTRAST TECHNIQUE: Contiguous axial images were obtained from the base of the skull through the vertex without intravenous contrast. COMPARISON:  Head CT 05/01/2021. FINDINGS: Brain: Cerebral volume is normal. There is no acute intracranial hemorrhage. No demarcated cortical infarct. No extra-axial fluid collection. No  evidence of an intracranial mass. No midline shift. Vascular: No hyperdense vessel.  Atherosclerotic calcifications. Skull: Normal. Negative for fracture or focal lesion. Sinuses/Orbits: Visualized orbits show no acute finding. No significant paranasal sinus disease at the imaged levels. ASPECTS (Alberta Stroke Program Early CT Score) - Ganglionic level infarction (caudate, lentiform nuclei, internal capsule, insula, M1-M3 cortex): 7 - Supraganglionic infarction (M4-M6 cortex): 3 Total score (0-10 with 10 being normal): 10 These results were called by telephone at the time of interpretation on 06/19/2021 at 3:36 pm to provider Dr. Aileen Pilot, who verbally acknowledged these results. IMPRESSION: No evidence of acute intracranial abnormality.  ASPECTS is 10. Electronically Signed   By: Jackey Loge D.O.   On: 06/19/2021 15:37    EKG: Independently reviewed.  Sinus rhythm, no ST-T wave changes.  Assessment/Plan Principal Problem:   TIA (transient ischemic attack) Active Problems:   Generalized anxiety disorder  TIA Admit to telemetry. Work-up to include A1c, lipid panel Echo  Generalized anxiety disorder Continue Wellbutrin  DVT prophylaxis: Lovenox SQ Code Status: Full code  Family Communication: Wife at bedside Disposition Plan: Home Consults called: Teleneurology by EDP Admission status: Observation patient is observation status due to ongoing work-up.   Reva Bores MD [facility]  If 7PM-7AM, please contact night-coverage 06/19/2021, 5:50 PM

## 2021-06-19 NOTE — ED Notes (Signed)
Stroke alert ended by neurologist

## 2021-06-19 NOTE — ED Triage Notes (Signed)
Pt c/o dizziness and near syncope while at work. Pt then noticed that his left arm was going numb. No hx of same.

## 2021-06-19 NOTE — ED Provider Notes (Signed)
I provided a substantive portion of the care of this patient.  I personally performed the entirety of the history, exam, and medical decision making for this encounter.   CRITICAL CARE Performed by: Vanetta Mulders Total critical care time: 45 minutes Critical care time was exclusive of separately billable procedures and treating other patients. Critical care was necessary to treat or prevent imminent or life-threatening deterioration. Critical care was time spent personally by me on the following activities: development of treatment plan with patient and/or surrogate as well as nursing, discussions with consultants, evaluation of patient's response to treatment, examination of patient, obtaining history from patient or surrogate, ordering and performing treatments and interventions, ordering and review of laboratory studies, ordering and review of radiographic studies, pulse oximetry and re-evaluation of patient's condition.     EKG Interpretation  Date/Time:  Tuesday June 19 2021 15:08:58 EDT Ventricular Rate:  71 PR Interval:  203 QRS Duration: 99 QT Interval:  380 QTC Calculation: 413 R Axis:   43 Text Interpretation: Sinus rhythm Borderline prolonged PR interval Low voltage, precordial leads Confirmed by Vanetta Mulders [phone]) on 06/19/2021 3:21:29 PM  Patient seen by me along with physician assistant.  Patient brought in by EMS.  Patient approximately 1 hour prior to arrival with symptoms that could be consistent with a TIA.  Patient had dizziness near syncope and noticed that left arm was going numb.  No prior history of anything similar.  All symptoms have resolved upon arrival here.  We did initiate code stroke for possibility of TIA.  In case symptoms came back and things got worse.  Head CT without any acute findings.  Evaluated by telemetry neurologist.  Recommending MRI and admission for TIA.  Patient's neuro exam remains normal.  No significant focal deficits at this time.   He is hypertensive with a blood pressure 171/103.  Labs without any significant abnormalities.     Vanetta Mulders, MD 06/19/21 [phone]

## 2021-06-19 NOTE — ED Provider Notes (Signed)
[facility] EMERGENCY DEPARTMENT Provider Note   CSN: [id number] Arrival date & time: 06/19/21  1450     History Chief Complaint  Patient presents with   Dizziness    Stephen [name] is a 60 y.o. male.   Dizziness Associated symptoms: no chest pain, no diarrhea, no headaches, no palpitations, no shortness of breath, no vomiting and no weakness       Stephen [name] is a 60 y.o. male who presents to the Emergency Department complaining of sudden onset dizziness and near syncope while at work.  Last known well at 2 PM today.  He states that he was talking on the telephone when he suddenly felt as though he was going to "pass out" he became dizzy and felt numbness and tingling of his left arm.  He also states that he felt slightly confused and took him longer to think about what he wanted to say.  No history of TIAs or strokes.  Symptoms lasted several minutes.  He took 4 baby aspirin's and called 911.  He denies any facial weakness, chest pain, nausea or vomiting.  No history of heart disease.  Had Covid 2 months ago.     Past Medical History:  Diagnosis Date   Depression    Hypertension     Patient Active Problem List   Diagnosis Date Noted   Elevated serum creatinine 08/25/2020   Generalized anxiety disorder 03/17/2018   Depression, recurrent (HCC) 03/17/2018    Past Surgical History:  Procedure Laterality Date   KNEE SURGERY         Family History  Problem Relation Age of Onset   Healthy Mother    Healthy Father     Social History   Tobacco Use   Smoking status: Never   Smokeless tobacco: Never  Substance Use Topics   Alcohol use: Yes   Drug use: Never    Home Medications Prior to Admission medications   Medication Sig Start Date End Date Taking? Authorizing Provider  buPROPion (WELLBUTRIN XL) 300 MG 24 hr tablet Take 1 tablet (300 mg total) by mouth daily. 05/02/21   Annalee Genta, DO  Multiple Vitamin (MULTIVITAMIN) tablet Take 1 tablet by  mouth daily.    [provider]  OVER THE COUNTER MEDICATION Vit D once daily    [provider]  vardenafil (LEVITRA) 20 MG tablet Take 1 tablet (20 mg total) by mouth daily as needed for erectile dysfunction. 04/28/20   Annalee Genta, DO    Allergies    Patient has no known allergies.  Review of Systems   Review of Systems  Constitutional:  Negative for appetite change and fever.  HENT:  Negative for trouble swallowing.   Eyes:  Negative for visual disturbance.  Respiratory:  Negative for shortness of breath.   Cardiovascular:  Negative for chest pain and palpitations.  Gastrointestinal:  Negative for abdominal pain, diarrhea and vomiting.  Genitourinary:  Negative for dysuria.  Musculoskeletal:  Negative for arthralgias.  Skin:  Negative for rash.  Neurological:  Positive for dizziness and numbness. Negative for seizures, syncope, speech difficulty, weakness and headaches.       Near syncope today.    Psychiatric/Behavioral:  Negative for confusion.    Physical Exam Updated Vital Signs BP (!) 171/103 (BP Location: Right Arm)   Pulse 71   Temp 97.9 F (36.6 C) (Oral)   Resp 16   Ht 5\' 10"  (1.778 m)   Wt 93 kg   SpO2  100%   BMI 29.41 kg/m   Physical Exam Vitals and nursing note reviewed.  Constitutional:      General: He is not in acute distress.    Appearance: Normal appearance. He is not ill-appearing.  HENT:     Head: Normocephalic.  Eyes:     Extraocular Movements: Extraocular movements intact.     Conjunctiva/sclera: Conjunctivae normal.     Pupils: Pupils are equal, round, and reactive to light.  Cardiovascular:     Rate and Rhythm: Normal rate and regular rhythm.     Pulses: Normal pulses.  Pulmonary:     Effort: Pulmonary effort is normal. No respiratory distress.  Chest:     Chest wall: No tenderness.  Abdominal:     General: There is no distension.     Palpations: Abdomen is soft.  Musculoskeletal:     Cervical back: Normal  range of motion. No tenderness.     Right lower leg: No edema.     Left lower leg: No edema.  Skin:    General: Skin is warm.     Capillary Refill: Capillary refill takes less than 2 seconds.     Findings: No rash.  Neurological:     General: No focal deficit present.     Mental Status: He is alert and oriented to person, place, and time.     GCS: GCS eye subscore is 4. GCS verbal subscore is 5. GCS motor subscore is 6.     Sensory: Sensation is intact. No sensory deficit.     Motor: Motor function is intact. No weakness, abnormal muscle tone or pronator drift.     Coordination: Coordination is intact. Heel to [facility] Test normal.     Comments: Cranial nerves II through XII intact.  Speech clear.  No pronator drift.  Normal finger-nose and heel shin testing.    ED Results / Procedures / Treatments   Labs (all labs ordered are listed, but only abnormal results are displayed) Labs Reviewed  COMPREHENSIVE METABOLIC PANEL - Abnormal; Notable for the following components:      Result Value   Creatinine, Ser 1.38 (*)    GFR, Estimated 59 (*)    All other components within normal limits  I-STAT CHEM 8, ED - Abnormal; Notable for the following components:   Creatinine, Ser 1.50 (*)    All other components within normal limits  RESP PANEL BY RT-PCR (FLU A&B, COVID) ARPGX2  ETHANOL  PROTIME-INR  APTT  CBC  DIFFERENTIAL  RAPID URINE DRUG SCREEN, HOSP PERFORMED  URINALYSIS, ROUTINE W REFLEX MICROSCOPIC  CBG MONITORING, ED  TROPONIN I (HIGH SENSITIVITY)  TROPONIN I (HIGH SENSITIVITY)    EKG EKG Interpretation  Date/Time:  Tuesday June 19 2021 15:08:58 EDT Ventricular Rate:  71 PR Interval:  203 QRS Duration: 99 QT Interval:  380 QTC Calculation: 413 R Axis:   43 Text Interpretation: Sinus rhythm Borderline prolonged PR interval Low voltage, precordial leads Confirmed by Vanetta Mulders [phone]) on 06/19/2021 3:21:29 PM  Radiology MR Brain Wo Contrast (neuro protocol)  Result  Date: 06/19/2021 CLINICAL DATA:  Transient ischemic attack (TIA). Additional history provided: Dizziness and near syncopal episode, left-sided arm numbness. EXAM: MRI HEAD WITHOUT CONTRAST TECHNIQUE: Multiplanar, multiecho pulse sequences of the brain and surrounding structures were obtained without intravenous contrast. COMPARISON:  Non-contrast head CT performed earlier today 06/19/2021. FINDINGS: Brain: Cerebral volume is normal for age. There are a few small scattered foci of T2/FLAIR hyperintensity within the cerebral white matter, nonspecific but  compatible with chronic small vessel ischemic disease. No cortical encephalomalacia is identified. There is no acute infarct. No evidence of an intracranial mass. No chronic intracranial blood products. No extra-axial fluid collection. No midline shift. Vascular: Maintained flow voids within the proximal large arterial vessels. Dominant left vertebral artery. Skull and upper cervical spine: No focal suspicious marrow lesion. Sinuses/Orbits: Visualized orbits show no acute finding. No significant paranasal sinus disease. IMPRESSION: No evidence of acute intracranial abnormality. Minimal chronic small-vessel ischemic changes within the cerebral white matter. Electronically Signed   By: Jackey Loge D.O.   On: 06/19/2021 18:04   DG Chest Portable 1 View  Result Date: 06/19/2021 CLINICAL DATA:  Dizziness, weakness, confusion EXAM: PORTABLE CHEST 1 VIEW COMPARISON:  Chest radiograph 05/01/2021 FINDINGS: The cardiomediastinal silhouette is normal. The lungs are clear, with no focal consolidation or pulmonary edema. There is no pleural effusion or pneumothorax. There is no acute osseous abnormality. IMPRESSION: No radiographic evidence of acute cardiopulmonary process. Electronically Signed   By: Lesia Hausen M.D.   On: 06/19/2021 16:50   CT HEAD CODE STROKE WO CONTRAST  Result Date: 06/19/2021 CLINICAL DATA:  Code stroke. Transient ischemic attack. Dizziness and  near syncopal episode, left-sided arm numbness. EXAM: CT HEAD WITHOUT CONTRAST TECHNIQUE: Contiguous axial images were obtained from the base of the skull through the vertex without intravenous contrast. COMPARISON:  Head CT 05/01/2021. FINDINGS: Brain: Cerebral volume is normal. There is no acute intracranial hemorrhage. No demarcated cortical infarct. No extra-axial fluid collection. No evidence of an intracranial mass. No midline shift. Vascular: No hyperdense vessel.  Atherosclerotic calcifications. Skull: Normal. Negative for fracture or focal lesion. Sinuses/Orbits: Visualized orbits show no acute finding. No significant paranasal sinus disease at the imaged levels. ASPECTS (Alberta Stroke Program Early CT Score) - Ganglionic level infarction (caudate, lentiform nuclei, internal capsule, insula, M1-M3 cortex): 7 - Supraganglionic infarction (M4-M6 cortex): 3 Total score (0-10 with 10 being normal): 10 These results were called by telephone at the time of interpretation on 06/19/2021 at 3:36 pm to provider Dr. Aileen Pilot, who verbally acknowledged these results. IMPRESSION: No evidence of acute intracranial abnormality.  ASPECTS is 10. Electronically Signed   By: Jackey Loge D.O.   On: 06/19/2021 15:37    Procedures Procedures   Medications Ordered in ED Medications - No data to display  ED Course  I have reviewed the triage vital signs and the nursing notes.  Pertinent labs & imaging results that were available during my care of the patient were reviewed by me and considered in my medical decision making (see chart for details).   CRITICAL CARE Performed by: Cortez Steelman Total critical care time: 40 minutes Critical care time was exclusive of separately billable procedures and treating other patients. Critical care was necessary to treat or prevent imminent or life-threatening deterioration. Critical care was time spent personally by me on the following activities: development of treatment  plan with patient and/or surrogate as well as nursing, discussions with consultants, evaluation of patient's response to treatment, examination of patient, obtaining history from patient or surrogate, ordering and performing treatments and interventions, ordering and review of laboratory studies, ordering and review of radiographic studies, pulse oximetry and re-evaluation of patient's condition.    MDM Rules/Calculators/A&P                           NIH stroke scale 0  Pain and here with sudden onset of dizzy, decreased concentration and near syncope.  Last known well 2 PM today.  No history of CVAs or TIAs.  Patient took four 81 mg aspirin prior to arrival.  1518  On my exam, patient well-appearing nontoxic.  Reassuring neurological testing.  Concern for possible TIA, Code stroke activated.  1535 CT head results negative  1545 [facility] with telemetry neuro who recommends no intervention at this time, admit to telemetry for TIA work-up and likely echo.  1735 discussed findings with [facility] hospitalist, Dr. Shawnie PonsPratt who agrees to admit.   Final Clinical Impression(s) / ED Diagnoses Final diagnoses:  TIA (transient ischemic attack)    Rx / DC Orders ED Discharge Orders     None        Pauline Ausriplett, Jaimin Krupka, PA-C 06/19/21 1814    Vanetta MuldersZackowski, Scott, MD 06/22/21 [phone]

## 2021-06-19 NOTE — Consult Note (Signed)
NEUROLOGY TELECONSULTATION NOTE   Date of service: June 19, 2021 Patient Name: Stephen [name] MRN:  [id number] DOB:  10/28/1961 Reason for consult: telestroke for vertigo and incoordination  Requesting Provider: Bethann Berkshire MD Consult Participants: myself, patient, bedside RN, telestroke RN Location of the provider: [facility] Location of the patient: [facility]  This consult was provided via telemedicine with 2-way video and audio communication. The patient/family was informed that care would be provided in this way and agreed to receive care in this manner.   _ _ _   _ __   _ __ _ _  __ __   _ __   __ _  History of Present Illness   This is a 60 year old gentleman with a history of depression and hypertension who presented to the emergency department after a near syncopal episode followed by dizziness and transient left arm tingling.  He also felt slightly confused and said that it took him longer to think about what he wanted to stay and expressive than usual.  LKW 1400, sx lasted a few min. He has no history of TIAs or strokes.  He is not on anticoagulation.  He was not on a daily antiplatelet but did take 325 mg of aspirin prior to calling 911.  He currently "feels a little bit off and and coordinated" but denies any focal neurologic deficits.  He has no history of TIA, stroke, or seizure.  He had COVID 2 months ago.  On examination his NIH stroke scale was 0 therefore he was not a candidate for tPA.  Head CT showed no acute intracranial abnormality.  ASPECTS 10.  CTA was not performed due to exam not being consistent with LVO.   ROS   Per HPI; all other systems reviewed and are negative  Past History   Past Medical History:  Diagnosis Date   Depression    Hypertension    Past Surgical History:  Procedure Laterality Date   KNEE SURGERY     Family History  Problem Relation Age of Onset   Healthy Mother    Healthy Father    Social History   Socioeconomic History    Marital status: Married    Spouse name: Not on file   Number of children: Not on file   Years of education: Not on file   Highest education level: Not on file  Occupational History   Not on file  Tobacco Use   Smoking status: Never   Smokeless tobacco: Never  Substance and Sexual Activity   Alcohol use: Yes   Drug use: Never   Sexual activity: Not on file  Other Topics Concern   Not on file  Social History Narrative   Not on file   Social Determinants of Health   Financial Resource Strain: Not on file  Food Insecurity: Not on file  Transportation Needs: Not on file  Physical Activity: Not on file  Stress: Not on file  Social Connections: Not on file   No Known Allergies  Medications   No current facility-administered medications for this encounter.  Current Outpatient Medications:    buPROPion (WELLBUTRIN XL) 300 MG 24 hr tablet, Take 1 tablet (300 mg total) by mouth daily., Disp: 90 tablet, Rfl: 1   Multiple Vitamin (MULTIVITAMIN) tablet, Take 1 tablet by mouth daily., Disp: , Rfl:    OVER THE COUNTER MEDICATION, Vit D once daily, Disp: , Rfl:    vardenafil (LEVITRA) 20 MG tablet, Take 1 tablet (20 mg total)  by mouth daily as needed for erectile dysfunction., Disp: 24 tablet, Rfl: 11     Vitals   Vitals:   06/19/21 1456 06/19/21 1530  BP: (!) 171/103 (!) 134/96  Pulse: 71 67  Resp: 16 (!) 30  Temp: 97.9 F (36.6 C)   TempSrc: Oral   SpO2: 100% 99%  Weight: 93 kg   Height: 5\' 10"  (1.778 m)      Body mass index is 29.41 kg/m.  Physical Exam   Exam performed over telemedicine with 2-way video and audio communication and with assistance of bedside RN  Physical Exam Gen: A&O x4, NAD Resp: normal WOB CV: extremities appear well-perfused  Neuro: *MS: A&O x4. Follows multi-step commands.  *Speech: nondysarthric, no aphasia, able to name and repeat *CN: PERRL 50mm, EOMI, VFF by confrontation, sensation intact, smile symmetric, hearing intact to  voice *Motor:   Normal bulk.  No tremor, rigidity or bradykinesia. No pronator drift. All extremities appear full-strength and symmetric. *Sensory: SILT. Symmetric. No double-simultaneous extinction.  *Coordination:  Finger-to-nose, heel-to-shin, rapid alternating motions were intact. *Reflexes:  UTA 2/2 tele-exam *Gait: deferred  NIHSS = 0 at 1520 on 06/19/21   Premorbid mRS = 0   Labs   CBC: No results for input(s): WBC, NEUTROABS, HGB, HCT, MCV, PLT in the last 168 hours.  Basic Metabolic Panel:  Lab Results  Component Value Date   NA 140 05/01/2021   K 3.9 05/01/2021   CO2 24 05/01/2021   GLUCOSE 99 05/01/2021   BUN 13 05/01/2021   CREATININE 1.32 (H) 05/01/2021   CALCIUM 8.9 05/01/2021   GFRNONAA >60 05/01/2021   GFRAA 68 08/23/2020   Lipid Panel:  Lab Results  Component Value Date   LDLCALC 75 08/23/2020   HgbA1c: No results found for: HGBA1C Urine Drug Screen: No results found for: LABOPIA, COCAINSCRNUR, LABBENZ, AMPHETMU, THCU, LABBARB  Alcohol Level No results found for: ETH   Impression   This is a 60 year old gentleman with a history of depression and hypertension who presented to the emergency department after a near syncopal episode followed by dizziness and transient left arm tingling.  tPA was not administered 2/2 NIHSS of 0. He still feels slightly "off and incoordinated" but he had no focal deficits on my exam. D/w ED and recommended obs admission for workup of possible TIA and near-syncope.  Recommendations   - Admit to hospitalist service - In-house neurology consult when available - Permissive HTN x48 hrs from sx onset or until stroke ruled out by MRI goal BP <220/110. PRN labetalol or hydralazine if BP above these parameters. Avoid oral antihypertensives. - MRI brain wo contrast - MRA H&N - TTE w/ bubble - Check A1c and LDL + add statin per guidelines - ASA 81mg  daily. This would be considered a low risk TIA based on ABCD2 score = 0 therefore  DAPT not indicated - q4 hr neuro checks - STAT head CT for any change in neuro exam - Tele - PT/OT/SLP - Stroke education - Amb referral to neurology upon discharge    ______________________________________________________________________   Thank you for the opportunity to take part in the care of this patient. If you have any further questions, please contact the neurology consultation attending.  Signed,  46, MD [facility] [phone]  If 7pm- 7am, please page neurology on call as listed in AMION.

## 2021-06-20 ENCOUNTER — Observation Stay (HOSPITAL_BASED_OUTPATIENT_CLINIC_OR_DEPARTMENT_OTHER): Payer: BC Managed Care – PPO

## 2021-06-20 DIAGNOSIS — G459 Transient cerebral ischemic attack, unspecified: Secondary | ICD-10-CM

## 2021-06-20 DIAGNOSIS — R55 Syncope and collapse: Secondary | ICD-10-CM

## 2021-06-20 LAB — HEMOGLOBIN A1C
Hgb A1c MFr Bld: 5.3 % (ref 4.8–5.6)
Mean Plasma Glucose: 105.41 mg/dL

## 2021-06-20 LAB — ECHOCARDIOGRAM COMPLETE
AR max vel: 2.34 cm2
AV Area VTI: 2.36 cm2
AV Area mean vel: 2.22 cm2
AV Mean grad: 2 mmHg
AV Peak grad: 4.3 mmHg
Ao pk vel: 1.04 m/s
Area-P 1/2: 2.87 cm2
Height: 70 in
MV VTI: 2.37 cm2
S' Lateral: 3.4 cm
Weight: 3280 oz

## 2021-06-20 LAB — COMPREHENSIVE METABOLIC PANEL
ALT: 21 U/L (ref 0–44)
AST: 16 U/L (ref 15–41)
Albumin: 3.7 g/dL (ref 3.5–5.0)
Alkaline Phosphatase: 58 U/L (ref 38–126)
Anion gap: 6 (ref 5–15)
BUN: 16 mg/dL (ref 6–20)
CO2: 26 mmol/L (ref 22–32)
Calcium: 8.9 mg/dL (ref 8.9–10.3)
Chloride: 107 mmol/L (ref 98–111)
Creatinine, Ser: 1.36 mg/dL — ABNORMAL HIGH (ref 0.61–1.24)
GFR, Estimated: 60 mL/min — ABNORMAL LOW (ref >60)
Glucose, Bld: 87 mg/dL (ref 70–99)
Potassium: 4.1 mmol/L (ref 3.5–5.1)
Sodium: 139 mmol/L (ref 135–145)
Total Bilirubin: 0.6 mg/dL (ref 0.3–1.2)
Total Protein: 6.7 g/dL (ref 6.5–8.1)

## 2021-06-20 LAB — CBC
HCT: 41.5 % (ref 39.0–52.0)
Hemoglobin: 13.8 g/dL (ref 13.0–17.0)
MCH: 31.8 pg (ref 26.0–34.0)
MCHC: 33.3 g/dL (ref 30.0–36.0)
MCV: 95.6 fL (ref 80.0–100.0)
Platelets: 218 10*3/uL (ref 150–400)
RBC: 4.34 MIL/uL (ref 4.22–5.81)
RDW: 12.4 % (ref 11.5–15.5)
WBC: 7.3 10*3/uL (ref 4.0–10.5)
nRBC: 0 % (ref 0.0–0.2)

## 2021-06-20 LAB — LIPID PANEL
Cholesterol: 148 mg/dL (ref 0–200)
HDL: 39 mg/dL — ABNORMAL LOW (ref >40)
LDL Cholesterol: 84 mg/dL (ref 0–99)
Total CHOL/HDL Ratio: 3.8 RATIO
Triglycerides: 126 mg/dL (ref <150)
VLDL: 25 mg/dL (ref 0–40)

## 2021-06-20 LAB — HIV ANTIBODY (ROUTINE TESTING W REFLEX): HIV Screen 4th Generation wRfx: NONREACTIVE

## 2021-06-20 MED ORDER — ASPIRIN 325 MG PO TABS: 325.0000 mg | ORAL_TABLET | Freq: Every day | ORAL | 2 refills | Status: AC

## 2021-06-20 NOTE — Discharge Summary (Signed)
Physician Discharge Summary [facility]    Patient: Stephen [name]                   Admit date: 06/19/2021   DOB: 1961-09-05             Discharge date:06/20/2021/1:03 PM OAC:[id number]                          PCP: Annalee Genta, DO  Disposition: HOME  Recommendations for Outpatient Follow-up:   Follow up: With PCP 4-6 weeks Continue full dose aspirin Continue close monitoring of blood pressure, may be reevaluated by PCP for further evaluation and possibility of initiating BP meds as outpatient  Discharge Condition: Stable   Code Status:   Code Status: Full Code  Diet recommendation: Cardiac diet   Discharge Diagnoses:    Principal Problem:   TIA (transient ischemic attack) Active Problems:   Generalized anxiety disorder   History of Present Illness/ Hospital Course Charline Bills Summary:   Stephen [name] is a 60 y.o. male with medical history significant of erectile dysfunction, anxiety and depression who was in his usual state of health until around 2 PM today.  He was talking on the phone and became lightheaded, dizzy, slightly confused.  Patient did take a Levitra this morning as well.  He reports some numbness and tingling in his left arm.  He has not really returned to his baseline and decided to come to the hospital.  He did take 4 aspirin at home. ED Course: In the ED his exam was noted to be nonfocal, imaging was negative.  Teleneurology recommended admission for TIA work-up.  Subsequently patient was admitted for TIA Work-up was completed including MRI of the brain which was within normal limits Labs-within normal limits-including electrolytes With exception of mildly elevated creatinine 1.50, improved to 1.36 Urine drug screen -negative Echocardiogram reviewed, mild grade 1 diastolic dysfunction otherwise normal ejection fraction Fasting lipid panel reviewed, LDL 84, HDL 39 total cholesterol 148, triglyceride 126  Recommended patient to  continue full dose aspirin, follow with PCP may reduce aspirin from 325 to 81 mg daily.  Continue healthy diet exercise... Will need close monitoring of blood pressure, if continued to be elevated may need medication for better blood pressure control as recommended by PCP  Normalized anxiety disorder, continue home medication of Wellbutrin      Discharge Instructions:   Discharge Instructions     Activity as tolerated - No restrictions   Complete by: As directed    Call MD for:  extreme fatigue   Complete by: As directed    Call MD for:  persistant dizziness or light-headedness   Complete by: As directed    Call MD for:  redness, tenderness, or signs of infection (pain, swelling, redness, odor or green/yellow discharge around incision site)   Complete by: As directed    Call MD for:  temperature >100.4   Complete by: As directed    Diet - low sodium heart healthy   Complete by: As directed    Discharge instructions   Complete by: As directed    Continue taking full dose aspirin.... With food to avoid side effects Follow-up with PCP in the next 3 to 6 weeks for further evaluation   Increase activity slowly   Complete by: As directed         Medication List     STOP taking these medications    OVER  THE COUNTER MEDICATION       TAKE these medications    aspirin 325 MG tablet Take 1 tablet (325 mg total) by mouth daily. Start taking on: June 21, 2021   buPROPion 300 MG 24 hr tablet Commonly known as: WELLBUTRIN XL Take 1 tablet (300 mg total) by mouth daily. What changed: Another medication with the same name was removed. Continue taking this medication, and follow the directions you see here.   Cholecalciferol 50 MCG (2000 UT) Tabs Take 1 tablet by mouth daily.   multivitamin tablet Take 1 tablet by mouth daily.   oxymetazoline 0.05 % nasal spray Commonly known as: AFRIN Place 2 sprays into both nostrils at bedtime.   vardenafil 20 MG tablet Commonly  known as: LEVITRA Take 1 tablet (20 mg total) by mouth daily as needed for erectile dysfunction. What changed: additional instructions        No Known Allergies   Procedures /Studies:   MR Brain Wo Contrast (neuro protocol)  Result Date: 06/19/2021 CLINICAL DATA:  Transient ischemic attack (TIA). Additional history provided: Dizziness and near syncopal episode, left-sided arm numbness. EXAM: MRI HEAD WITHOUT CONTRAST TECHNIQUE: Multiplanar, multiecho pulse sequences of the brain and surrounding structures were obtained without intravenous contrast. COMPARISON:  Non-contrast head CT performed earlier today 06/19/2021. FINDINGS: Brain: Cerebral volume is normal for age. There are a few small scattered foci of T2/FLAIR hyperintensity within the cerebral white matter, nonspecific but compatible with chronic small vessel ischemic disease. No cortical encephalomalacia is identified. There is no acute infarct. No evidence of an intracranial mass. No chronic intracranial blood products. No extra-axial fluid collection. No midline shift. Vascular: Maintained flow voids within the proximal large arterial vessels. Dominant left vertebral artery. Skull and upper cervical spine: No focal suspicious marrow lesion. Sinuses/Orbits: Visualized orbits show no acute finding. No significant paranasal sinus disease. IMPRESSION: No evidence of acute intracranial abnormality. Minimal chronic small-vessel ischemic changes within the cerebral white matter. Electronically Signed   By: Jackey Loge D.O.   On: 06/19/2021 18:04   DG Chest Portable 1 View  Result Date: 06/19/2021 CLINICAL DATA:  Dizziness, weakness, confusion EXAM: PORTABLE CHEST 1 VIEW COMPARISON:  Chest radiograph 05/01/2021 FINDINGS: The cardiomediastinal silhouette is normal. The lungs are clear, with no focal consolidation or pulmonary edema. There is no pleural effusion or pneumothorax. There is no acute osseous abnormality. IMPRESSION: No radiographic  evidence of acute cardiopulmonary process. Electronically Signed   By: Lesia Hausen M.D.   On: 06/19/2021 16:50   ECHOCARDIOGRAM COMPLETE  Result Date: 06/20/2021    ECHOCARDIOGRAM REPORT   Patient Name:   CEBASTIAN NEIS Date of Exam: 06/20/2021 Medical Rec #:  [id number]           Height:       70.0 in Accession #:    [id number]          Weight:       205.0 lb Date of Birth:  08/28/1961           BSA:          2.109 m Patient Age:    59 years            BP:           122/85 mmHg Patient Gender: M                   HR:           66 bpm. [facility] Procedure: 2D Echo, Cardiac Doppler and Color Doppler Indications:    TIA  History:        Patient has no prior history of Echocardiogram examinations.                 Pre-Syncope.  Sonographer:    Mikki Harbororothy Buchanan Referring Phys: [id number] Kenney HousemanANYA S PRATT IMPRESSIONS  1. Left ventricular ejection fraction, by estimation, is 55 to 60%. The left ventricle has normal function. The left ventricle has no regional wall motion abnormalities. Left ventricular diastolic parameters are consistent with Grade I diastolic dysfunction (impaired relaxation).  2. Right ventricular systolic function is normal. The right ventricular size is normal. There is normal pulmonary artery systolic pressure.  3. The mitral valve is normal in structure. Trivial mitral valve regurgitation. No evidence of mitral stenosis.  4. The aortic valve is tricuspid. Aortic valve regurgitation is not visualized. No aortic stenosis is present. FINDINGS  Left Ventricle: Left ventricular ejection fraction, by estimation, is 55 to 60%. The left ventricle has normal function. The left ventricle has no regional wall motion abnormalities. The left ventricular internal cavity size was normal in size. There is  no left ventricular hypertrophy. Left ventricular diastolic parameters are consistent with Grade I diastolic dysfunction (impaired relaxation). Normal left ventricular filling pressure. Right  Ventricle: The right ventricular size is normal. No increase in right ventricular wall thickness. Right ventricular systolic function is normal. There is normal pulmonary artery systolic pressure. The tricuspid regurgitant velocity is 2.36 m/s, and  with an assumed right atrial pressure of 3 mmHg, the estimated right ventricular systolic pressure is 25.3 mmHg. Left Atrium: Left atrial size was normal in size. Right Atrium: Right atrial size was normal in size. Pericardium: There is no evidence of pericardial effusion. Mitral Valve: The mitral valve is normal in structure. Trivial mitral valve regurgitation. No evidence of mitral valve stenosis. MV peak gradient, 3.1 mmHg. The mean mitral valve gradient is 1.0 mmHg. Tricuspid Valve: The tricuspid valve is normal in structure. Tricuspid valve regurgitation is not demonstrated. No evidence of tricuspid stenosis. Aortic Valve: The aortic valve is tricuspid. Aortic valve regurgitation is not visualized. No aortic stenosis is present. Aortic valve mean gradient measures 2.0 mmHg. Aortic valve peak gradient measures 4.3 mmHg. Aortic valve area, by VTI measures 2.36 cm. Pulmonic Valve: The pulmonic valve was not well visualized. Pulmonic valve regurgitation is not visualized. No evidence of pulmonic stenosis. Aorta: The aortic root is normal in size and structure. IAS/Shunts: The interatrial septum was not well visualized.  LEFT VENTRICLE PLAX 2D LVIDd:         5.03 cm  Diastology LVIDs:         3.40 cm  LV e' medial:    8.24 cm/s LV PW:         1.10 cm  LV E/e' medial:  7.2 LV IVS:        1.05 cm  LV e' lateral:   9.02 cm/s LVOT diam:     2.00 cm  LV E/e' lateral: 6.5 LV SV:         57 LV SV Index:   27 LVOT Area:     3.14 cm  RIGHT VENTRICLE RV Basal diam:  4.31 cm RV Mid diam:    3.15 cm RV S prime:     9.79 cm/s RVOT diam:      2.70 cm TAPSE (M-mode): 2.7 cm LEFT ATRIUM  Index       RIGHT ATRIUM           Index LA diam:        4.30 cm 2.04 cm/m  RA Area:      14.60 cm LA Vol (A2C):   38.9 ml 18.44 ml/m RA Volume:   32.70 ml  15.50 ml/m LA Vol (A4C):   36.0 ml 17.07 ml/m LA Biplane Vol: 38.7 ml 18.35 ml/m  AORTIC VALVE AV Area (Vmax):    2.34 cm AV Area (Vmean):   2.22 cm AV Area (VTI):     2.36 cm AV Vmax:           104.00 cm/s AV Vmean:          70.300 cm/s AV VTI:            0.240 m AV Peak Grad:      4.3 mmHg AV Mean Grad:      2.0 mmHg LVOT Vmax:         77.30 cm/s LVOT Vmean:        49.700 cm/s LVOT VTI:          0.180 m LVOT/AV VTI ratio: 0.75  AORTA Ao Asc diam: 3.40 cm MITRAL VALVE               TRICUSPID VALVE MV Area (PHT): 2.87 cm    TR Peak grad:   22.3 mmHg MV Area VTI:   2.37 cm    TR Vmax:        236.00 cm/s MV Peak grad:  3.1 mmHg MV Mean grad:  1.0 mmHg    SHUNTS MV Vmax:       0.88 m/s    Systemic VTI:  0.18 m MV Vmean:      43.7 cm/s   Systemic Diam: 2.00 cm MV Decel Time: 264 msec    Pulmonic Diam: 2.70 cm MV E velocity: 59.00 cm/s MV A velocity: 70.60 cm/s MV E/A ratio:  0.84 Dina Rich MD Electronically signed by Dina Rich MD Signature Date/Time: 06/20/2021/12:46:44 PM    Final    CT HEAD CODE STROKE WO CONTRAST  Result Date: 06/19/2021 CLINICAL DATA:  Code stroke. Transient ischemic attack. Dizziness and near syncopal episode, left-sided arm numbness. EXAM: CT HEAD WITHOUT CONTRAST TECHNIQUE: Contiguous axial images were obtained from the base of the skull through the vertex without intravenous contrast. COMPARISON:  Head CT 05/01/2021. FINDINGS: Brain: Cerebral volume is normal. There is no acute intracranial hemorrhage. No demarcated cortical infarct. No extra-axial fluid collection. No evidence of an intracranial mass. No midline shift. Vascular: No hyperdense vessel.  Atherosclerotic calcifications. Skull: Normal. Negative for fracture or focal lesion. Sinuses/Orbits: Visualized orbits show no acute finding. No significant paranasal sinus disease at the imaged levels. ASPECTS (Alberta Stroke Program Early CT Score) -  Ganglionic level infarction (caudate, lentiform nuclei, internal capsule, insula, M1-M3 cortex): 7 - Supraganglionic infarction (M4-M6 cortex): 3 Total score (0-10 with 10 being normal): 10 These results were called by telephone at the time of interpretation on 06/19/2021 at 3:36 pm to provider Dr. Aileen Pilot, who verbally acknowledged these results. IMPRESSION: No evidence of acute intracranial abnormality.  ASPECTS is 10. Electronically Signed   By: Jackey Loge D.O.   On: 06/19/2021 15:37    Subjective:   Patient was seen and examined 06/20/2021, 1:03 PM Patient stable today. No acute distress.  No issues overnight Stable for discharge.  Discharge Exam:    Vitals:   06/20/21 0113  06/20/21 0302 06/20/21 0447 06/20/21 0934  BP: 132/85 (!) 132/91 122/85 140/83  Pulse: 64 73 72 72  Resp: Temp: 98 F (36.7 C) 98.2 F (36.8 C) 98.2 F (36.8 C)   TempSrc: Oral Oral    SpO2: 100% 96% 100% 99%  Weight:      Height:        General: Pt lying comfortably in bed & appears in no obvious distress. Cardiovascular: S1 & S2 heard, RRR, S1/S2 +. No murmurs, rubs, gallops or clicks. No JVD or pedal edema. Respiratory: Clear to auscultation without wheezing, rhonchi or crackles. No increased work of breathing. Abdominal:  Non-distended, non-tender & soft. No organomegaly or masses appreciated. Normal bowel sounds heard. CNS: Alert and oriented. No focal deficits. Extremities: no edema, no cyanosis      The results of significant diagnostics from this hospitalization (including imaging, microbiology, ancillary and laboratory) are listed below for reference.      Microbiology:   Recent Results (from the past 240 hour(s))  Resp Panel by RT-PCR (Flu A&B, Covid) Nasopharyngeal Swab     Status: None   Collection Time: 06/19/21  3:40 PM   Specimen: Nasopharyngeal Swab; Nasopharyngeal(NP) swabs in vial transport medium  Result Value Ref Range Status   SARS Coronavirus 2 by RT PCR NEGATIVE  NEGATIVE Final    Comment: (NOTE) SARS-CoV-2 target nucleic acids are NOT DETECTED.  The SARS-CoV-2 RNA is generally detectable in upper respiratory specimens during the acute phase of infection. The lowest concentration of SARS-CoV-2 viral copies this assay can detect is 138 copies/mL. A negative result does not preclude SARS-Cov-2 infection and should not be used as the sole basis for treatment or other patient management decisions. A negative result may occur with  improper specimen collection/handling, submission of specimen other than nasopharyngeal swab, presence of viral mutation(s) within the areas targeted by this assay, and inadequate number of viral copies(<138 copies/mL). A negative result must be combined with clinical observations, patient history, and epidemiological information. The expected result is Negative.  Fact Sheet for Patients:  BloggerCourse.com  Fact Sheet for Healthcare Providers:  SeriousBroker.it  This test is no t yet approved or cleared by the [place] FDA and  has been authorized for detection and/or diagnosis of SARS-CoV-2 by FDA under an Emergency Use Authorization (EUA). This EUA will remain  in effect (meaning this test can be used) for the duration of the COVID-19 declaration under Section 564(b)(1) of the Act, 21 U.S.C.section 360bbb-3(b)(1), unless the authorization is terminated  or revoked sooner.       Influenza A by PCR NEGATIVE NEGATIVE Final   Influenza B by PCR NEGATIVE NEGATIVE Final    Comment: (NOTE) The Xpert Xpress SARS-CoV-2/FLU/RSV plus assay is intended as an aid in the diagnosis of influenza from Nasopharyngeal swab specimens and should not be used as a sole basis for treatment. Nasal washings and aspirates are unacceptable for Xpert Xpress SARS-CoV-2/FLU/RSV testing.  Fact Sheet for Patients: BloggerCourse.com  Fact Sheet for Healthcare  Providers: SeriousBroker.it  This test is not yet approved or cleared by the [place] FDA and has been authorized for detection and/or diagnosis of SARS-CoV-2 by FDA under an Emergency Use Authorization (EUA). This EUA will remain in effect (meaning this test can be used) for the duration of the COVID-19 declaration under Section 564(b)(1) of the Act, 21 U.S.C. section 360bbb-3(b)(1), unless the authorization is terminated or revoked.  Performed at [facility], [address], [place],  [place] [address]      Labs:   CBC: Recent Labs  Lab 06/19/21 1517 06/19/21 1546 06/20/21 0422  WBC 6.5  --  7.3  NEUTROABS 4.3  --   --   HGB 14.5 14.3 13.8  HCT 43.5 42.0 41.5  MCV 96.2  --  95.6  PLT 234  --  218   Basic Metabolic Panel: Recent Labs  Lab 06/19/21 1517 06/19/21 1546 06/20/21 0422  NA 138 141 139  K 4.0 4.2 4.1  CL 106 104 107  CO2 26  --  26  GLUCOSE 95 95 87  BUN CREATININE 1.38* 1.50* 1.36*  CALCIUM 9.1  --  8.9   Liver Function Tests: Recent Labs  Lab 06/19/21 1517 06/20/21 0422  AST 20 16  ALT 23 21  ALKPHOS 67 58  BILITOT 0.6 0.6  PROT 7.8 6.7  ALBUMIN 4.5 3.7   BNP (last 3 results) No results for input(s): BNP in the last 8760 hours. Cardiac Enzymes: No results for input(s): CKTOTAL, CKMB, CKMBINDEX, TROPONINI in the last 168 hours. CBG: Recent Labs  Lab 06/19/21 1508  GLUCAP 84   Hgb A1c Recent Labs    06/19/21 1517  HGBA1C 5.3   Lipid Profile Recent Labs    06/20/21 0422  CHOL 148  HDL 39*  LDLCALC 84  TRIG 604  CHOLHDL 3.8   Thyroid function studies Recent Labs    06/19/21 1517  TSH 1.093   Anemia work up No results for input(s): VITAMINB12, FOLATE, FERRITIN, TIBC, IRON, RETICCTPCT in the last 72 hours. Urinalysis    Component Value Date/Time   COLORURINE STRAW (A) 06/19/2021 1826   APPEARANCEUR CLEAR 06/19/2021 1826   LABSPEC 1.006 06/19/2021 1826   PHURINE 7.0 06/19/2021  1826   GLUCOSEU NEGATIVE 06/19/2021 1826   HGBUR SMALL (A) 06/19/2021 1826   BILIRUBINUR NEGATIVE 06/19/2021 1826   KETONESUR NEGATIVE 06/19/2021 1826   PROTEINUR NEGATIVE 06/19/2021 1826   NITRITE NEGATIVE 06/19/2021 1826   LEUKOCYTESUR NEGATIVE 06/19/2021 1826         Time coordinating discharge: Over 45 minutes  SIGNED: Kendell Bane, MD, FACP, FHM. [facility],  Please use [email or web address] to Page If 7PM-7AM, please contact night-coverage [email or web address] 06/20/2021, 1:03 PM

## 2021-06-20 NOTE — Evaluation (Signed)
Physical Therapy Evaluation Only Patient Details Name: Stephen [name] MRN: [id number] DOB: [birth year] Today's Date: 06/20/2021   History of Present Illness  Stephen [name] is a 60 y.o. male who presents with c/o lightheaded, dizzy and slightly confused, numbness/tingling in L arm. MRI: No evidence of acute intracranial abnormality. PMH: erectile dysfunction, anxiety and depression   Clinical Impression  Pt is at baseline, symmetrical BLE, normal sensation, low fall risk for DGI, romberg stance and tandem stance, independent with ambulation and mobility. No acute/follow up PT needs identified. Pt demonstrates safe ambulation independently with good balance. Will sign off at this time.     Follow Up Recommendations No PT follow up    Equipment Recommendations  None recommended by PT    Recommendations for Other Services       Precautions / Restrictions Precautions Precautions: None Restrictions Weight Bearing Restrictions: No      Mobility  Bed Mobility  General bed mobility comments: in recliner    Transfers Overall transfer level: Independent   Ambulation/Gait Ambulation/Gait assistance: Independent Gait Distance (Feet): 360 Feet Assistive device: None Gait Pattern/deviations: WFL(Within Functional Limits) Gait velocity: WFL   General Gait Details: Pt able to change directions, speeds, sudden start/stop, complete cervical rotation, and navigate sloped surfaces without LOB, equal bil step length, equal bil foot clearance, no drift or sway  Stairs            Wheelchair Mobility    Modified Rankin (Stroke Patients Only)       Balance Overall balance assessment: Independent (Pt assumes tandem stance and romberg stance and able to maintain independently for >10 sec)  Standardized Balance Assessment Standardized Balance Assessment : Dynamic Gait Index   Dynamic Gait Index Level Surface: Normal Change in Gait Speed: Normal Gait with Horizontal Head  Turns: Normal Gait with Vertical Head Turns: Normal Gait and Pivot Turn: Normal Step Over Obstacle: Normal Step Around Obstacles: Normal Steps: Normal Total Score: 24       Pertinent Vitals/Pain Pain Assessment: No/denies pain    Home Living Family/patient expects to be discharged to:: Private residence Living Arrangements: Spouse/significant other Available Help at Discharge: Family Type of Home: House Home Access: Stairs to enter   Secretary/administrator of Steps: 1 Home Layout: Two level;Laundry or work area in basement (home office in basement) Home Equipment: None      Prior Function Level of Independence: Independent  Comments: 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, 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     Higher education careers adviser   Dominant Hand: Right    Extremity/Trunk Assessment   Upper Extremity Assessment Upper Extremity Assessment: Defer to OT evaluation    Lower Extremity Assessment Lower Extremity Assessment: Overall WFL for tasks assessed;RLE deficits/detail;LLE deficits/detail RLE Deficits / Details: AROM WNL, strength 5/5 throughout, symmetrical, denies numbness/tingling RLE Sensation: WNL RLE Coordination: WNL LLE Deficits / Details: AROM WNL, strength 5/5 throughout, symmetrical, denies numbness/tingling LLE Sensation: WNL LLE Coordination: WNL    Cervical / Trunk Assessment Cervical / Trunk Assessment: Normal  Communication   Communication: No difficulties  Cognition Arousal/Alertness: Awake/alert Behavior During Therapy: WFL for tasks assessed/performed Overall Cognitive Status: Within Functional Limits for tasks assessed     General Comments      Exercises     Assessment/Plan    PT Assessment Patent does not need any further PT services  PT Problem List         PT Treatment Interventions      PT Goals (Current goals can be found in  the Care Plan section)  Acute Rehab PT Goals Patient Stated Goal: "check my baseline" PT Goal Formulation: All  assessment and education complete, DC therapy    Frequency     Barriers to discharge        Co-evaluation PT/OT/SLP Co-Evaluation/Treatment: Yes Reason for Co-Treatment: To address functional/ADL transfers PT goals addressed during session: Balance;Mobility/safety with mobility         AM-PAC PT "6 Clicks" Mobility  Outcome Measure Help needed turning from your back to your side while in a flat bed without using bedrails?: None Help needed moving from lying on your back to sitting on the side of a flat bed without using bedrails?: None Help needed moving to and from a bed to a chair (including a wheelchair)?: None Help needed standing up from a chair using your arms (e.g., wheelchair or bedside chair)?: None Help needed to walk in hospital room?: None Help needed climbing 3-5 steps with a railing? : None 6 Click Score: 24    End of Session   Activity Tolerance: Patient tolerated treatment well Patient left: in chair;with nursing/sitter in room Nurse Communication: Mobility status PT Visit Diagnosis: Other symptoms and signs involving the nervous system (R29.898)    Time: 8295-6213 PT Time Calculation (min) (ACUTE ONLY): 14 min   Charges:   PT Evaluation $PT Eval Low Complexity: 1 Low          Tori Abdullahi Vallone PT, DPT 06/20/21, 8:39 AM

## 2021-06-20 NOTE — Progress Notes (Signed)
*  PRELIMINARY RESULTS* Echocardiogram 2D Echocardiogram has been performed.  Carolyne Fiscal 06/20/2021, 9:26 AM

## 2021-06-20 NOTE — Evaluation (Signed)
Occupational Therapy Evaluation Patient Details Name: Stephen [name] MRN: [id number] DOB: Jul 24, 1961 Today's Date: 06/20/2021    History of Present Illness Stephen [name] is a 60 y.o. male who presents with c/o lightheaded, dizzy and slightly confused, numbness/tingling in L arm. MRI: No evidence of acute intracranial abnormality. PMH: erectile dysfunction, anxiety and depression   Clinical Impression   Pt agreeable to OT/PT co-evaluation. Pt demonstrates WNL B UE strength, coordination, and fine motor skills. Pt was able to ambulate in the hall with independence. Pt reports mild blurred vision in past couple days but nothing that significantly impacts vision. Pt appears to be at or near baseline function. Pt is not recommended for further acute OT services and will be discharged to care of nursing staff for the remainder of his hospital stay.     Follow Up Recommendations  No OT follow up    Equipment Recommendations  None recommended by OT           Precautions / Restrictions Precautions Precautions: None Restrictions Weight Bearing Restrictions: No      Mobility Bed Mobility               General bed mobility comments: in recliner    Transfers Overall transfer level: Independent                    Balance Overall balance assessment: Independent                               Standardized Balance Assessment Standardized Balance Assessment : Dynamic Gait Index   Dynamic Gait Index Level Surface: Normal Change in Gait Speed: Normal Gait with Horizontal Head Turns: Normal Gait with Vertical Head Turns: Normal Gait and Pivot Turn: Normal Step Over Obstacle: Normal Step Around Obstacles: Normal Steps: Normal Total Score: 24     ADL either performed or assessed with clinical judgement   ADL Overall ADL's : Independent                                             Vision Baseline Vision/History: Wears  glasses Wears Glasses: At all times Patient Visual Report: Blurring of vision (Pt reports slight blury vision but nothing very significant. It does not impact his ability to read board in room.) Vision Assessment?: No apparent visual deficits                Pertinent Vitals/Pain Pain Assessment: No/denies pain     Hand Dominance Right   Extremity/Trunk Assessment Upper Extremity Assessment Upper Extremity Assessment: Overall WFL for tasks assessed   Lower Extremity Assessment Lower Extremity Assessment: Defer to PT evaluation RLE Deficits / Details: AROM WNL, strength 5/5 throughout, symmetrical, denies numbness/tingling RLE Sensation: WNL RLE Coordination: WNL LLE Deficits / Details: AROM WNL, strength 5/5 throughout, symmetrical, denies numbness/tingling LLE Sensation: WNL LLE Coordination: WNL   Cervical / Trunk Assessment Cervical / Trunk Assessment: Normal   Communication Communication Communication: No difficulties   Cognition Arousal/Alertness: Awake/alert Behavior During Therapy: WFL for tasks assessed/performed Overall Cognitive Status: Within Functional Limits for tasks assessed  Home Living Family/patient expects to be discharged to:: Private residence Living Arrangements: Spouse/significant other Available Help at Discharge: Family Type of Home: House Home Access: Stairs to enter Secretary/administrator of Steps: 1   Home Layout: Laundry or work area in Engineer, site of Steps: Flight Alternate Level Stairs-Rails: Right (up; flight to basement with L side rail going down.) Bathroom Shower/Tub: Producer, television/film/video: Standard     Home Equipment: None          Prior Functioning/Environment Level of Independence: Independent        Comments: 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, 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                  OT Goals(Current goals can be found in the care plan section) Acute Rehab OT Goals Patient Stated Goal: "check my baseline"                   Co-evaluation PT/OT/SLP Co-Evaluation/Treatment: Yes Reason for Co-Treatment: To address functional/ADL transfers PT goals addressed during session: Balance;Mobility/safety with mobility OT goals addressed during session: ADL's and self-care;Strengthening/ROM      AM-PAC OT "6 Clicks" Daily Activity     Outcome Measure Help from another person eating meals?: None Help from another person taking care of personal grooming?: None Help from another person toileting, which includes using toliet, bedpan, or urinal?: None Help from another person bathing (including washing, rinsing, drying)?: None Help from another person to put on and taking off regular upper body clothing?: None Help from another person to put on and taking off regular lower body clothing?: None 6 Click Score: 24   End of Session    Activity Tolerance: Patient tolerated treatment well Patient left: in chair  OT Visit Diagnosis: Other symptoms and signs involving the nervous system (Y19.509)                Time: 3267-1245 OT Time Calculation (min): 13 min Charges:  OT General Charges $OT Visit: 1 Visit OT Evaluation $OT Eval Low Complexity: 1 Low  Trenise Turay OT, MOT  Danie Chandler 06/20/2021, 11:25 AM

## 2021-07-02 ENCOUNTER — Telehealth: Payer: Self-pay | Admitting: Family Medicine

## 2021-07-02 NOTE — Telephone Encounter (Signed)
Patient left voice mail stating he has gotten something in his right eye that causing irritation to that eye . Please advise

## 2021-07-03 NOTE — Telephone Encounter (Signed)
Needs urgent care if has foreign body in the eye.  Thx   Dr. Ladona Ridgel

## 2021-07-03 NOTE — Telephone Encounter (Addendum)
Patient advised that Dr Stephen [name] recommends patient to go to urgent care to be checked for foreign body in eye.Patient states his eye is doing much better and he thinks it may have just been a scratch. Patient also scheduled follow up from an recent ER visit for TIA

## 2021-07-11 ENCOUNTER — Other Ambulatory Visit: Payer: Self-pay

## 2021-07-11 ENCOUNTER — Encounter: Payer: Self-pay | Admitting: Family Medicine

## 2021-07-11 ENCOUNTER — Ambulatory Visit (INDEPENDENT_AMBULATORY_CARE_PROVIDER_SITE_OTHER): Payer: BC Managed Care – PPO | Admitting: Family Medicine

## 2021-07-11 VITALS — BP 131/88 | HR 60 | Temp 97.2°F | Wt 207.8 lb

## 2021-07-11 DIAGNOSIS — R030 Elevated blood-pressure reading, without diagnosis of hypertension: Secondary | ICD-10-CM | POA: Diagnosis not present

## 2021-07-11 DIAGNOSIS — R7989 Other specified abnormal findings of blood chemistry: Secondary | ICD-10-CM | POA: Diagnosis not present

## 2021-07-11 DIAGNOSIS — R55 Syncope and collapse: Secondary | ICD-10-CM

## 2021-07-11 DIAGNOSIS — G459 Transient cerebral ischemic attack, unspecified: Secondary | ICD-10-CM | POA: Diagnosis not present

## 2021-07-11 NOTE — Progress Notes (Signed)
Patient ID: Stephen [name], male    DOB: Jan 14, 1961, 60 y.o.   MRN: [id number]   Chief Complaint  Patient presents with   ER Follow Up   Loss of Consciousness   Subjective:    HPI Pt here for ER follow up syncope episode. Pt went to [facility] ER 06/19/21.  Pt "blacked out" at desk for a few seconds. Pt had taken 1/3 dose of levitra prior to this episode.  Has been on this medication for years.  Pt had COVID back in June and since then he has strange things happen. Pt on 325 mg Asprin daily; ER told him that after follow up may be able to go to 81 mg.   Pt stating ahd a "black out "episode and sitting down. Lights out quickly and pt pullled his head back up.  Felt off balance some. Wondered and told step daughter to go to ER. Covid in 6/22.  After discharge pt was to start- Full asa,  Monitor bp.  Doing well with full asa.  Mri brain- normal.  Pt was on bp meds- then bp started to get low so stopped the bp meds. No longer on bp meds.  Lower in am and higher in evenings Was on norvasc 10mg . Checked if for 2 wks after stopping it and was stable.   Kidney function was slightly elevated. Cr 1.36, in 06/20/21.  Better than last time at 1.5.   Medical History Derrek has a past medical history of Depression and Hypertension.   Outpatient Encounter Medications as of 07/11/2021  Medication Sig   aspirin 325 MG tablet Take 1 tablet (325 mg total) by mouth daily.   buPROPion (WELLBUTRIN XL) 300 MG 24 hr tablet Take 1 tablet (300 mg total) by mouth daily.   Cholecalciferol 50 MCG (2000 UT) TABS Take 1 tablet by mouth daily.   Multiple Vitamin (MULTIVITAMIN) tablet Take 1 tablet by mouth daily.   oxymetazoline (AFRIN) 0.05 % nasal spray Place 2 sprays into both nostrils at bedtime.   [DISCONTINUED] vardenafil (LEVITRA) 20 MG tablet Take 1 tablet (20 mg total) by mouth daily as needed for erectile dysfunction. (Patient taking differently: Take 20 mg by mouth daily as  needed for erectile dysfunction. Take 1/3 tablet daily as needed)   No facility-administered encounter medications on file as of 07/11/2021.     Review of Systems  Constitutional:  Negative for chills and fever.  HENT:  Negative for congestion, rhinorrhea and sore throat.   Respiratory:  Negative for cough, shortness of breath and wheezing.   Cardiovascular:  Negative for chest pain and leg swelling.  Gastrointestinal:  Negative for abdominal pain, diarrhea, nausea and vomiting.  Genitourinary:  Negative for dysuria and frequency.  Skin:  Negative for rash.  Neurological:  Negative for dizziness, weakness and headaches.    Vitals BP 131/88   Pulse 60   Temp (!) 97.2 F (36.2 C)   Wt 207 lb 12.8 oz (94.3 kg)   SpO2 97%   BMI 29.82 kg/m   Objective:   Physical Exam Vitals and nursing note reviewed.  Constitutional:      General: He is not in acute distress.    Appearance: Normal appearance. He is not ill-appearing.  HENT:     Head: Normocephalic.     Nose: Nose normal. No congestion.     Mouth/Throat:     Mouth: Mucous membranes are moist.     Pharynx: No oropharyngeal exudate.  Eyes:  Extraocular Movements: Extraocular movements intact.     Conjunctiva/sclera: Conjunctivae normal.     Pupils: Pupils are equal, round, and reactive to light.  Cardiovascular:     Rate and Rhythm: Normal rate and regular rhythm.     Pulses: Normal pulses.     Heart sounds: Normal heart sounds. No murmur heard. Pulmonary:     Effort: Pulmonary effort is normal.     Breath sounds: Normal breath sounds. No wheezing, rhonchi or rales.  Musculoskeletal:        General: Normal range of motion.     Right lower leg: No edema.     Left lower leg: No edema.  Skin:    General: Skin is warm and dry.     Findings: No rash.  Neurological:     General: No focal deficit present.     Mental Status: He is alert and oriented to person, place, and time.     Cranial Nerves: No cranial nerve  deficit.  Psychiatric:        Mood and Affect: Mood normal.        Behavior: Behavior normal.        Thought Content: Thought content normal.        Judgment: Judgment normal.     Assessment and Plan   1. TIA (transient ischemic attack)  2. Elevated serum creatinine  3. Syncope, unspecified syncope type  4. Elevated blood pressure reading   Elevated blood pressure- suboptimal.  Not on meds currently. Pt wanting to checking bp 2x per day and recording it and f/u with new provider.    Elevated Cr- in ER Cr 1.50 then dec to 1.36 on 06/20/21.. Recheck bmp for recheck of Cr on next visit with new provider.  Syncope- pt had taken levitra prior to the event.  This could have caused the result, however, pt was admitted and dx with TIA.  Pt is on asa.  Pt is to follow up with neuro.  TIA- follow up with neurology.  Advised could take 81mg  asa instead of 325mg . Per neurology note, he could decrease it to 81mg  daily.  Addendum- pt called back wanting refill of levitra.  Reviewed with pt the concerns of this, pt stating the ER doctor didn't think this was the concern.  However, the side effects of levitra are syncope, flushed feeling, dizziness, nausea etc.  Gave small course, pt stating only taking 1/3 of tablet.  Pt stating has taken it a few times since the hospitalization and no further side effects. Advising to stop if has another episode after taking levitra and f/u with neuro.  Pt in agreement.   Return in about 4 months (around 11/10/2021) for recheck bp.

## 2021-07-16 ENCOUNTER — Telehealth: Payer: Self-pay | Admitting: Family Medicine

## 2021-07-16 ENCOUNTER — Other Ambulatory Visit: Payer: Self-pay | Admitting: Family Medicine

## 2021-07-16 DIAGNOSIS — D492 Neoplasm of unspecified behavior of bone, soft tissue, and skin: Secondary | ICD-10-CM | POA: Diagnosis not present

## 2021-07-16 DIAGNOSIS — L570 Actinic keratosis: Secondary | ICD-10-CM | POA: Diagnosis not present

## 2021-07-16 MED ORDER — VARDENAFIL HCL 20 MG PO TABS: ORAL_TABLET | ORAL | 0 refills | Status: DC

## 2021-07-16 NOTE — Telephone Encounter (Signed)
Mychart message sent to patient.

## 2021-07-16 NOTE — Telephone Encounter (Signed)
Message sent to provider as telephone call

## 2021-07-16 NOTE — Telephone Encounter (Signed)
Pt replied via my chart.; I told the ER Doctor I had taken 1/3 that morning. They didn't think it had anything to do with it. I've used it several times since then. I take 1/3 off a 20mg  dose. It's Vardenafil.

## 2021-07-16 NOTE — Progress Notes (Signed)
Gave small course of vardenafil till pt can be seen by next provider.   Dr. Ladona Ridgel

## 2021-07-17 NOTE — Telephone Encounter (Signed)
Per Epic ; pt did read MyChart message

## 2021-09-07 ENCOUNTER — Ambulatory Visit (INDEPENDENT_AMBULATORY_CARE_PROVIDER_SITE_OTHER): Payer: BC Managed Care – PPO | Admitting: Internal Medicine

## 2021-09-07 ENCOUNTER — Encounter: Payer: Self-pay | Admitting: Internal Medicine

## 2021-09-07 ENCOUNTER — Other Ambulatory Visit: Payer: Self-pay

## 2021-09-07 ENCOUNTER — Ambulatory Visit: Payer: BC Managed Care – PPO | Admitting: Internal Medicine

## 2021-09-07 VITALS — BP 122/82 | HR 80 | Ht 70.0 in | Wt 209.0 lb

## 2021-09-07 DIAGNOSIS — Z23 Encounter for immunization: Secondary | ICD-10-CM | POA: Diagnosis not present

## 2021-09-07 DIAGNOSIS — F339 Major depressive disorder, recurrent, unspecified: Secondary | ICD-10-CM | POA: Diagnosis not present

## 2021-09-07 DIAGNOSIS — Z125 Encounter for screening for malignant neoplasm of prostate: Secondary | ICD-10-CM

## 2021-09-07 DIAGNOSIS — F411 Generalized anxiety disorder: Secondary | ICD-10-CM

## 2021-09-07 DIAGNOSIS — N529 Male erectile dysfunction, unspecified: Secondary | ICD-10-CM | POA: Insufficient documentation

## 2021-09-07 DIAGNOSIS — Z1159 Encounter for screening for other viral diseases: Secondary | ICD-10-CM

## 2021-09-07 DIAGNOSIS — R7303 Prediabetes: Secondary | ICD-10-CM

## 2021-09-07 DIAGNOSIS — G459 Transient cerebral ischemic attack, unspecified: Secondary | ICD-10-CM

## 2021-09-07 MED ORDER — VARDENAFIL HCL 20 MG PO TABS: ORAL_TABLET | ORAL | 1 refills | Status: DC

## 2021-09-07 NOTE — Patient Instructions (Signed)
Please continue taking medications as prescribed.  Please get fasting blood tests before the next visits.

## 2021-09-07 NOTE — Assessment & Plan Note (Signed)
Well-controlled with Wellbutrin ?

## 2021-09-07 NOTE — Assessment & Plan Note (Signed)
Had episodes of numbness of arms and inattention - ? TIA/panic episode MRI brain showed chronic microvascular changes Lipid profile reviewed No chronic comorbid conditions

## 2021-09-07 NOTE — Progress Notes (Signed)
New Patient Office Visit  Subjective:  Patient ID: Stephen [name], male    DOB: 1961-06-15  Age: 60 y.o. MRN: [id number]  CC:  Chief Complaint  Patient presents with   New Patient (Initial Visit)    HPI Stephen [name] is a 60 y.o. male with past medical history of depression, anxiety and erectile dysfunction who presents for establishing care.  He takes Wellbutrin for depression and anxiety.  He has tried Zoloft and Lexapro in the past.  He received BH therapy in the past as well.  He had an episode of inattention and numbness of the arms in 04/2021, for which she went to ER.  CT of the head was unremarkable.  MRI of the brain in 06/2021 showed chronic microvascular changes without any signs of acute or subacute ischemia.  He does admit to having stress at work and he had that episode while he was on a video call at work.  He works as a Oncologist.  He takes Levitra 1/3 tablet as needed for ED.  Denies any dysuria or hematuria currently.  His creatinine remains around 1.3 at baseline.  His GFR has been around 60.  He used to do weight training and running in the past.  He has cut down protein intake recently.  He has had 1 J&J and 1 dose of modern a vaccine.  He received flu vaccine in the office today.  Past Medical History:  Diagnosis Date   Depression    Hypertension     Past Surgical History:  Procedure Laterality Date   KNEE SURGERY      Family History  Problem Relation Age of Onset   Healthy Mother    Healthy Father     Social History   Socioeconomic History   Marital status: Married    Spouse name: Not on file   Number of children: Not on file   Years of education: Not on file   Highest education level: Not on file  Occupational History   Not on file  Tobacco Use   Smoking status: Never   Smokeless tobacco: Never  Substance and Sexual Activity   Alcohol use: Yes   Drug use: Never   Sexual activity: Not on file  Other Topics Concern   Not on file   Social History Narrative   Not on file   Social Determinants of Health   Financial Resource Strain: Not on file  Food Insecurity: Not on file  Transportation Needs: Not on file  Physical Activity: Not on file  Stress: Not on file  Social Connections: Not on file  Intimate Partner Violence: Not on file    ROS Review of Systems  Constitutional:  Negative for chills and fever.  HENT:  Negative for congestion and sore throat.   Eyes:  Negative for pain and discharge.  Respiratory:  Negative for cough and shortness of breath.   Cardiovascular:  Negative for chest pain and palpitations.  Gastrointestinal:  Negative for constipation, diarrhea, nausea and vomiting.  Endocrine: Negative for polydipsia and polyuria.  Genitourinary:  Negative for dysuria and hematuria.  Musculoskeletal:  Negative for neck pain and neck stiffness.  Skin:  Negative for rash.  Neurological:  Negative for dizziness, weakness, numbness and headaches.  Psychiatric/Behavioral:  Negative for agitation and behavioral problems.    Objective:   Today's Vitals: BP 122/82 (BP Location: Left Arm, Cuff Size: Normal)   Pulse 80   Ht '5\' 10"'  (1.778 m)   Wt 209 lb (  94.8 kg)   SpO2 97%   BMI 29.99 kg/m   Physical Exam Vitals reviewed.  Constitutional:      General: He is not in acute distress.    Appearance: He is not diaphoretic.  HENT:     Head: Normocephalic and atraumatic.     Nose: Nose normal.     Mouth/Throat:     Mouth: Mucous membranes are moist.  Eyes:     General: No scleral icterus.    Extraocular Movements: Extraocular movements intact.  Cardiovascular:     Rate and Rhythm: Normal rate and regular rhythm.     Pulses: Normal pulses.     Heart sounds: Normal heart sounds. No murmur heard. Pulmonary:     Breath sounds: Normal breath sounds. No wheezing or rales.  Musculoskeletal:     Cervical back: Neck supple. No tenderness.     Right lower leg: No edema.     Left lower leg: No edema.   Skin:    General: Skin is warm.     Findings: No rash.  Neurological:     General: No focal deficit present.     Mental Status: He is alert and oriented to person, place, and time.     Sensory: No sensory deficit.     Motor: No weakness.  Psychiatric:        Mood and Affect: Mood normal.        Behavior: Behavior normal.    Assessment & Plan:   Problem List Items Addressed This Visit       Cardiovascular and Mediastinum   TIA (transient ischemic attack)    Had episodes of numbness of arms and inattention - ? TIA/panic episode MRI brain showed chronic microvascular changes Lipid profile reviewed No chronic comorbid conditions      Relevant Medications   vardenafil (LEVITRA) 20 MG tablet     Other   Generalized anxiety disorder    Well-controlled with Wellbutrin      Depression, recurrent ([place]) - Primary    Flowsheet Row Office Visit from 09/07/2021 in [facility]  PHQ-2 Total Score 0  Well-controlled with Wellbutrin Has tried Zoloft and Lexapro in the past      Relevant Orders   CMP14+EGFR   Erectile dysfunction    Levitra PRN      Relevant Medications   vardenafil (LEVITRA) 20 MG tablet   Other Visit Diagnoses     Prediabetes       Relevant Orders   CMP14+EGFR   Hemoglobin A1c   Urinalysis   Need for hepatitis C screening test       Relevant Orders   Hepatitis C Antibody   Screening for prostate cancer       Relevant Orders   PSA   Need for immunization against influenza       Relevant Orders   Flu Vaccine QUAD 73moIM (Fluarix, Fluzone & Alfiuria Quad PF) (Completed)       Outpatient Encounter Medications as of 09/07/2021  Medication Sig   buPROPion (WELLBUTRIN XL) 300 MG 24 hr tablet Take 1 tablet (300 mg total) by mouth daily.   Cholecalciferol 50 MCG (2000 UT) TABS Take 1 tablet by mouth daily.   Multiple Vitamin (MULTIVITAMIN) tablet Take 1 tablet by mouth daily.   oxymetazoline (AFRIN) 0.05 % nasal spray Place 2 sprays into  both nostrils at bedtime.   [DISCONTINUED] vardenafil (LEVITRA) 20 MG tablet Take 1/3 tablet p.o. prn for erectile dysfunction.   vardenafil (LEVITRA) 20  MG tablet Take 1/3 tablet p.o. prn for erectile dysfunction.   No facility-administered encounter medications on file as of 09/07/2021.    Follow-up: Return in about 4 months (around 01/05/2022) for Annual physical.   Lindell Spar, MD

## 2021-09-07 NOTE — Assessment & Plan Note (Signed)
Flowsheet Row Office Visit from 09/07/2021 in [facility]  PHQ-2 Total Score 0     Well-controlled with Wellbutrin Has tried Zoloft and Lexapro in the past

## 2021-09-07 NOTE — Assessment & Plan Note (Signed)
Levitra PRN

## 2021-10-26 ENCOUNTER — Ambulatory Visit: Payer: BC Managed Care – PPO | Admitting: Internal Medicine

## 2021-10-30 DIAGNOSIS — G4733 Obstructive sleep apnea (adult) (pediatric): Secondary | ICD-10-CM | POA: Diagnosis not present

## 2021-11-14 DIAGNOSIS — G4733 Obstructive sleep apnea (adult) (pediatric): Secondary | ICD-10-CM | POA: Diagnosis not present

## 2021-11-27 ENCOUNTER — Other Ambulatory Visit: Payer: Self-pay | Admitting: Family Medicine

## 2021-11-27 ENCOUNTER — Other Ambulatory Visit: Payer: Self-pay | Admitting: Internal Medicine

## 2021-11-27 DIAGNOSIS — F411 Generalized anxiety disorder: Secondary | ICD-10-CM

## 2021-11-27 DIAGNOSIS — F339 Major depressive disorder, recurrent, unspecified: Secondary | ICD-10-CM

## 2021-12-11 DIAGNOSIS — D123 Benign neoplasm of transverse colon: Secondary | ICD-10-CM | POA: Diagnosis not present

## 2021-12-11 DIAGNOSIS — Z86010 Personal history of colonic polyps: Secondary | ICD-10-CM | POA: Diagnosis not present

## 2021-12-11 LAB — HM COLONOSCOPY

## 2022-01-11 ENCOUNTER — Ambulatory Visit (INDEPENDENT_AMBULATORY_CARE_PROVIDER_SITE_OTHER): Payer: BC Managed Care – PPO | Admitting: Internal Medicine

## 2022-01-11 ENCOUNTER — Encounter: Payer: Self-pay | Admitting: Internal Medicine

## 2022-01-11 ENCOUNTER — Other Ambulatory Visit: Payer: Self-pay

## 2022-01-11 VITALS — BP 138/88 | HR 70 | Resp 16 | Ht 70.0 in | Wt 212.2 lb

## 2022-01-11 DIAGNOSIS — Z125 Encounter for screening for malignant neoplasm of prostate: Secondary | ICD-10-CM

## 2022-01-11 DIAGNOSIS — F411 Generalized anxiety disorder: Secondary | ICD-10-CM

## 2022-01-11 DIAGNOSIS — Z1159 Encounter for screening for other viral diseases: Secondary | ICD-10-CM

## 2022-01-11 DIAGNOSIS — Z860100 Personal history of colon polyps, unspecified: Secondary | ICD-10-CM | POA: Insufficient documentation

## 2022-01-11 DIAGNOSIS — E559 Vitamin D deficiency, unspecified: Secondary | ICD-10-CM | POA: Diagnosis not present

## 2022-01-11 DIAGNOSIS — R079 Chest pain, unspecified: Secondary | ICD-10-CM | POA: Insufficient documentation

## 2022-01-11 DIAGNOSIS — F329 Major depressive disorder, single episode, unspecified: Secondary | ICD-10-CM | POA: Insufficient documentation

## 2022-01-11 DIAGNOSIS — G4700 Insomnia, unspecified: Secondary | ICD-10-CM | POA: Insufficient documentation

## 2022-01-11 DIAGNOSIS — N401 Enlarged prostate with lower urinary tract symptoms: Secondary | ICD-10-CM | POA: Insufficient documentation

## 2022-01-11 DIAGNOSIS — Z0001 Encounter for general adult medical examination with abnormal findings: Secondary | ICD-10-CM | POA: Insufficient documentation

## 2022-01-11 DIAGNOSIS — F339 Major depressive disorder, recurrent, unspecified: Secondary | ICD-10-CM

## 2022-01-11 DIAGNOSIS — Z23 Encounter for immunization: Secondary | ICD-10-CM

## 2022-01-11 DIAGNOSIS — M19019 Primary osteoarthritis, unspecified shoulder: Secondary | ICD-10-CM | POA: Insufficient documentation

## 2022-01-11 DIAGNOSIS — G4733 Obstructive sleep apnea (adult) (pediatric): Secondary | ICD-10-CM

## 2022-01-11 DIAGNOSIS — H9190 Unspecified hearing loss, unspecified ear: Secondary | ICD-10-CM | POA: Insufficient documentation

## 2022-01-11 DIAGNOSIS — R3911 Hesitancy of micturition: Secondary | ICD-10-CM | POA: Insufficient documentation

## 2022-01-11 MED ORDER — BUPROPION HCL ER (XL) 300 MG PO TB24: ORAL_TABLET | ORAL | 3 refills | Status: DC

## 2022-01-11 NOTE — Patient Instructions (Signed)
Please continue taking medications as prescribed.  Please continue to follow heart healthy diet and perform moderate exercise/walking at least 150 mins/week. 

## 2022-01-11 NOTE — Assessment & Plan Note (Signed)
Physical exam as documented. ?Fasting blood tests ordered today. ?First dose of Shingrix today. ?

## 2022-01-11 NOTE — Assessment & Plan Note (Signed)
Uses CPAP regularly °

## 2022-01-11 NOTE — Progress Notes (Signed)
? ?Established Patient Office Visit ? ?Subjective:  ?Patient ID: Stephen [name], male    DOB: Oct 02, 1961  Age: 61 y.o. MRN: [id number] ? ?CC:  ?Chief Complaint  ?Patient presents with  ? Annual Exam  ?  Annual exam has bp readings with him today   ? ? ?HPI ?Stephen [name] is a 61 y.o. male with past medical history of depression, anxiety and erectile dysfunction who presents for annual physical. ? ?He takes Wellbutrin for depression and anxiety.  He has tried Zoloft and Lexapro in the past.  He received BH therapy in the past as well.  Denies any anhedonia, SI or HI currently. ? ?He takes Levitra as needed for ED.  Denies any dysuria or hematuria currently. ? ?He received first dose of Shingrix vaccine in the office today. ? ? ?Past Medical History:  ?Diagnosis Date  ? Depression   ? Hypertension   ? ? ?Past Surgical History:  ?Procedure Laterality Date  ? KNEE SURGERY    ? ? ?Family History  ?Problem Relation Age of Onset  ? Healthy Mother   ? Healthy Father   ? ? ?Social History  ? ?Socioeconomic History  ? Marital status: Married  ?  Spouse name: Not on file  ? Number of children: Not on file  ? Years of education: Not on file  ? Highest education level: Not on file  ?Occupational History  ? Not on file  ?Tobacco Use  ? Smoking status: Never  ? Smokeless tobacco: Never  ?Substance and Sexual Activity  ? Alcohol use: Yes  ? Drug use: Never  ? Sexual activity: Not on file  ?Other Topics Concern  ? Not on file  ?Social History Narrative  ? Not on file  ? ?Social Determinants of Health  ? ?Financial Resource Strain: Not on file  ?Food Insecurity: Not on file  ?Transportation Needs: Not on file  ?Physical Activity: Not on file  ?Stress: Not on file  ?Social Connections: Not on file  ?Intimate Partner Violence: Not on file  ? ? ?Outpatient Medications Prior to Visit  ?Medication Sig Dispense Refill  ? Cholecalciferol 50 MCG (2000 UT) TABS Take 1 tablet by mouth daily.    ? Multiple Vitamin (MULTIVITAMIN)  tablet Take 1 tablet by mouth daily.    ? oxymetazoline (AFRIN) 0.05 % nasal spray Place 2 sprays into both nostrils at bedtime.    ? vardenafil (LEVITRA) 20 MG tablet Take 1/3 tablet p.o. prn for erectile dysfunction. 10 tablet 1  ? buPROPion (WELLBUTRIN XL) 300 MG 24 hr tablet TAKE (1) TABLET BY MOUTH ONCE DAILY. 90 tablet 0  ? ?No facility-administered medications prior to visit.  ? ? ?No Known Allergies ? ?ROS ?Review of Systems  ?Constitutional:  Negative for chills and fever.  ?HENT:  Negative for congestion and sore throat.   ?Eyes:  Negative for pain and discharge.  ?Respiratory:  Negative for cough and shortness of breath.   ?Cardiovascular:  Negative for chest pain and palpitations.  ?Gastrointestinal:  Negative for constipation, diarrhea, nausea and vomiting.  ?Endocrine: Negative for polydipsia and polyuria.  ?Genitourinary:  Negative for dysuria and hematuria.  ?Musculoskeletal:  Negative for neck pain and neck stiffness.  ?Skin:  Negative for rash.  ?Neurological:  Negative for dizziness, weakness, numbness and headaches.  ?Psychiatric/Behavioral:  Negative for agitation and behavioral problems.   ? ?  ?Objective:  ?  ?Physical Exam ?Vitals reviewed.  ?Constitutional:   ?   General: He is not in  acute distress. ?   Appearance: He is not diaphoretic.  ?HENT:  ?   Head: Normocephalic and atraumatic.  ?   Nose: Nose normal.  ?   Mouth/Throat:  ?   Mouth: Mucous membranes are moist.  ?Eyes:  ?   General: No scleral icterus. ?   Extraocular Movements: Extraocular movements intact.  ?Cardiovascular:  ?   Rate and Rhythm: Normal rate and regular rhythm.  ?   Pulses: Normal pulses.  ?   Heart sounds: Normal heart sounds. No murmur heard. ?Pulmonary:  ?   Breath sounds: Normal breath sounds. No wheezing or rales.  ?Abdominal:  ?   Palpations: Abdomen is soft.  ?   Tenderness: There is no abdominal tenderness.  ?Musculoskeletal:  ?   Cervical back: Neck supple. No tenderness.  ?   Right lower leg: No edema.  ?    Left lower leg: No edema.  ?Skin: ?   General: Skin is warm.  ?   Findings: No rash.  ?Neurological:  ?   General: No focal deficit present.  ?   Mental Status: He is alert and oriented to person, place, and time.  ?   Cranial Nerves: No cranial nerve deficit.  ?   Sensory: No sensory deficit.  ?   Motor: No weakness.  ?Psychiatric:     ?   Mood and Affect: Mood normal.     ?   Behavior: Behavior normal.  ? ? ?BP 138/88 (BP Location: Left Arm, Patient Position: Sitting, Cuff Size: Normal)   Pulse 70   Resp 16   Ht '5\' 10"'  (1.778 m)   Wt 212 lb 3.2 oz (96.3 kg)   SpO2 97%   BMI 30.45 kg/m?  ?Wt Readings from Last 3 Encounters:  ?01/11/22 212 lb 3.2 oz (96.3 kg)  ?09/07/21 209 lb (94.8 kg)  ?07/11/21 207 lb 12.8 oz (94.3 kg)  ? ? ?Lab Results  ?Component Value Date  ? TSH 1.093 06/19/2021  ? ?Lab Results  ?Component Value Date  ? WBC 7.3 06/20/2021  ? HGB 13.8 06/20/2021  ? HCT 41.5 06/20/2021  ? MCV 95.6 06/20/2021  ? PLT 218 06/20/2021  ? ?Lab Results  ?Component Value Date  ? NA 139 06/20/2021  ? K 4.1 06/20/2021  ? CO2 26 06/20/2021  ? GLUCOSE 87 06/20/2021  ? BUN 16 06/20/2021  ? CREATININE 1.36 (H) 06/20/2021  ? BILITOT 0.6 06/20/2021  ? ALKPHOS 58 06/20/2021  ? AST 16 06/20/2021  ? ALT 21 06/20/2021  ? PROT 6.7 06/20/2021  ? ALBUMIN 3.7 06/20/2021  ? CALCIUM 8.9 06/20/2021  ? ANIONGAP 6 06/20/2021  ? EGFR 63 02/07/2021  ? ?Lab Results  ?Component Value Date  ? CHOL 148 06/20/2021  ? ?Lab Results  ?Component Value Date  ? HDL 39 (L) 06/20/2021  ? ?Lab Results  ?Component Value Date  ? [place] 84 06/20/2021  ? ?Lab Results  ?Component Value Date  ? TRIG 126 06/20/2021  ? ?Lab Results  ?Component Value Date  ? CHOLHDL 3.8 06/20/2021  ? ?Lab Results  ?Component Value Date  ? HGBA1C 5.3 06/20/2021  ? ? ?  ?Assessment & Plan:  ? ?Problem List Items Addressed This Visit   ? ?Encounter for general adult medical examination with abnormal findings ?Physical exam as documented. ?Fasting blood tests ordered  today. ?First dose of Shingrix today. ? ?Prostate cancer screening ?Ordered PSA after discussing its limitations for prostate cancer screening, including false positive results leading additional investigations. ? ? ? ?  Other Visit Diagnoses   ? ? Vitamin D deficiency      ? Relevant Orders  ? VITAMIN D 25 Hydroxy (Vit-D Deficiency, Fractures)  ? Need for hepatitis C screening test      ? Relevant Orders  ? Hepatitis C Antibody  ? Need for varicella vaccine      ? Relevant Orders  ? Varicella-zoster vaccine IM (Shingrix) (Completed)  ? ?  ? ? ?Meds ordered this encounter  ?Medications  ? buPROPion (WELLBUTRIN XL) 300 MG 24 hr tablet  ?  Sig: TAKE (1) TABLET BY MOUTH ONCE DAILY.  ?  Dispense:  90 tablet  ?  Refill:  3  ? ? ?Follow-up: Return in about 1 year (around 01/12/2023) for Annual physical.  ? ? ?Lindell Spar, MD ?

## 2022-01-11 NOTE — Assessment & Plan Note (Signed)
Ordered PSA after discussing its limitations for prostate cancer screening, including false positive results leading additional investigations. 

## 2022-01-12 LAB — LIPID PANEL
Chol/HDL Ratio: 3.5 ratio (ref 0.0–5.0)
Cholesterol, Total: 163 mg/dL (ref 100–199)
HDL: 46 mg/dL (ref >39)
LDL Chol Calc (NIH): 94 mg/dL (ref 0–99)
Triglycerides: 129 mg/dL (ref 0–149)
VLDL Cholesterol Cal: 23 mg/dL (ref 5–40)

## 2022-01-12 LAB — TSH: TSH: 1.35 u[IU]/mL (ref 0.450–4.500)

## 2022-01-12 LAB — CMP14+EGFR
ALT: 18 IU/L (ref 0–44)
AST: 19 IU/L (ref 0–40)
Albumin/Globulin Ratio: 1.8 (ref 1.2–2.2)
Albumin: 4.9 g/dL (ref 3.8–4.9)
Alkaline Phosphatase: 72 IU/L (ref 44–121)
BUN/Creatinine Ratio: 11 (ref 10–24)
BUN: 14 mg/dL (ref 8–27)
Bilirubin Total: 0.5 mg/dL (ref 0.0–1.2)
CO2: 23 mmol/L (ref 20–29)
Calcium: 9.8 mg/dL (ref 8.6–10.2)
Chloride: 102 mmol/L (ref 96–106)
Creatinine, Ser: 1.28 mg/dL — ABNORMAL HIGH (ref 0.76–1.27)
Globulin, Total: 2.7 g/dL (ref 1.5–4.5)
Glucose: 82 mg/dL (ref 70–99)
Potassium: 5 mmol/L (ref 3.5–5.2)
Sodium: 141 mmol/L (ref 134–144)
Total Protein: 7.6 g/dL (ref 6.0–8.5)
eGFR: 64 mL/min/{1.73_m2} (ref >59)

## 2022-01-12 LAB — CBC WITH DIFFERENTIAL/PLATELET
Basophils Absolute: 0 10*3/uL (ref 0.0–0.2)
Basos: 1 %
EOS (ABSOLUTE): 0.2 10*3/uL (ref 0.0–0.4)
Eos: 3 %
Hematocrit: 46.1 % (ref 37.5–51.0)
Hemoglobin: 15.4 g/dL (ref 13.0–17.7)
Immature Grans (Abs): 0 10*3/uL (ref 0.0–0.1)
Immature Granulocytes: 0 %
Lymphocytes Absolute: 1.4 10*3/uL (ref 0.7–3.1)
Lymphs: 24 %
MCH: 30.7 pg (ref 26.6–33.0)
MCHC: 33.4 g/dL (ref 31.5–35.7)
MCV: 92 fL (ref 79–97)
Monocytes Absolute: 0.4 10*3/uL (ref 0.1–0.9)
Monocytes: 6 %
Neutrophils Absolute: 4 10*3/uL (ref 1.4–7.0)
Neutrophils: 66 %
Platelets: 282 10*3/uL (ref 150–450)
RBC: 5.01 x10E6/uL (ref 4.14–5.80)
RDW: 12.5 % (ref 11.6–15.4)
WBC: 6 10*3/uL (ref 3.4–10.8)

## 2022-01-12 LAB — HEMOGLOBIN A1C
Est. average glucose Bld gHb Est-mCnc: 105 mg/dL
Hgb A1c MFr Bld: 5.3 % (ref 4.8–5.6)

## 2022-01-12 LAB — HEPATITIS C ANTIBODY: Hep C Virus Ab: NONREACTIVE

## 2022-01-12 LAB — PSA: Prostate Specific Ag, Serum: 0.9 ng/mL (ref 0.0–4.0)

## 2022-01-12 LAB — VITAMIN D 25 HYDROXY (VIT D DEFICIENCY, FRACTURES): Vit D, 25-Hydroxy: 65.3 ng/mL (ref 30.0–100.0)

## 2022-01-23 ENCOUNTER — Encounter: Payer: Self-pay | Admitting: *Deleted

## 2022-03-18 ENCOUNTER — Other Ambulatory Visit: Payer: Self-pay | Admitting: Internal Medicine

## 2022-03-18 DIAGNOSIS — N529 Male erectile dysfunction, unspecified: Secondary | ICD-10-CM

## 2022-03-27 DIAGNOSIS — G4733 Obstructive sleep apnea (adult) (pediatric): Secondary | ICD-10-CM | POA: Diagnosis not present

## 2022-04-18 IMAGING — CT CT HEAD CODE STROKE
3 series · 15 of 47 positions shown, 18 images · non-contrast
Comparison: Head CT 05/01/2021.

CLINICAL DATA: Code stroke. Transient ischemic attack. Dizziness
and near syncopal episode, left-sided arm numbness.

EXAM:
CT HEAD WITHOUT CONTRAST
TECHNIQUE: Contiguous axial images were obtained from the base of the skull
through the vertex without intravenous contrast.

[Series 2: head w o · axial · 0.47mm/px · z∈[+54,+190]mm · 9 of 33 slices shown, 12 images]
[im 3/33  brain]
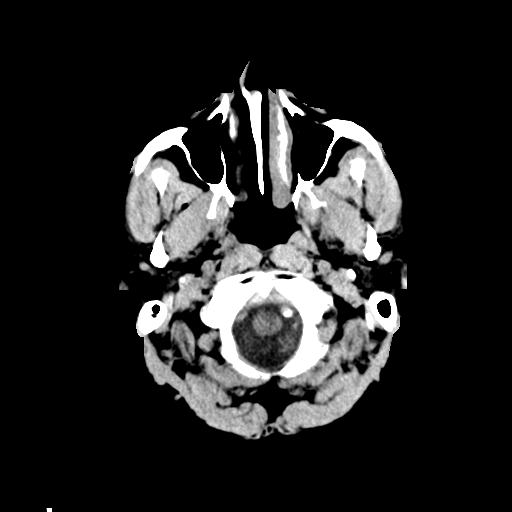
[im 3/33  bone]
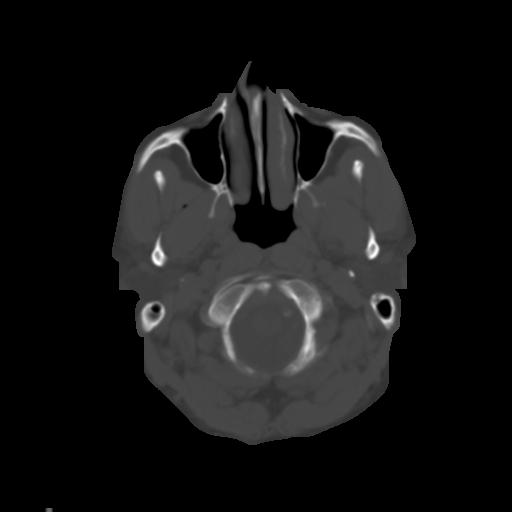
[im 6/33  brain]
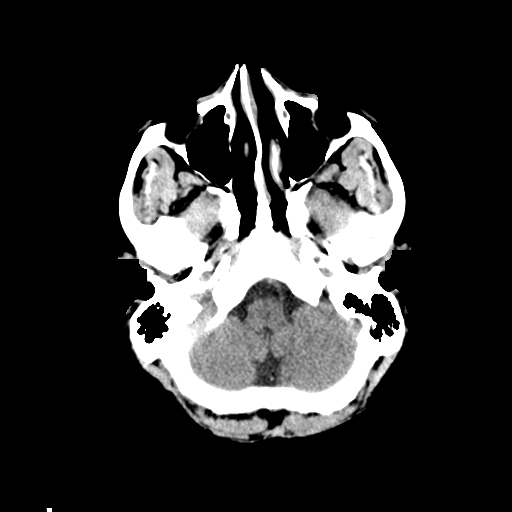
[im 9/33  brain]
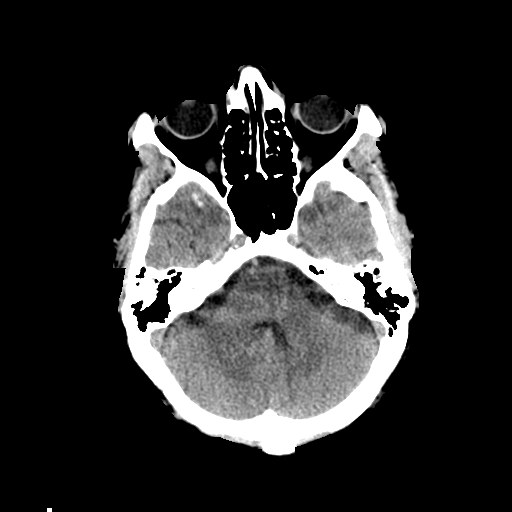
[im 13/33  brain]
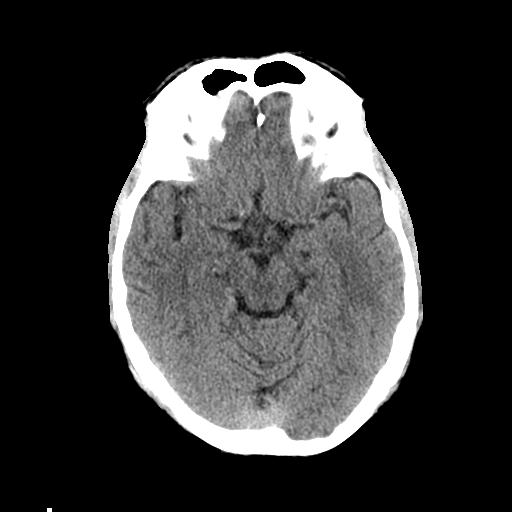
[im 17/33  brain]
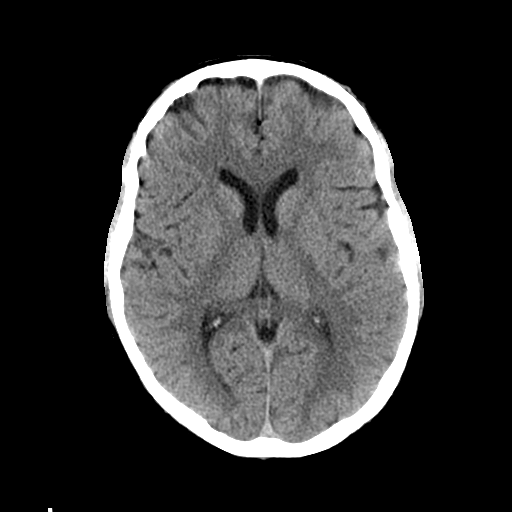
[im 17/33  bone]
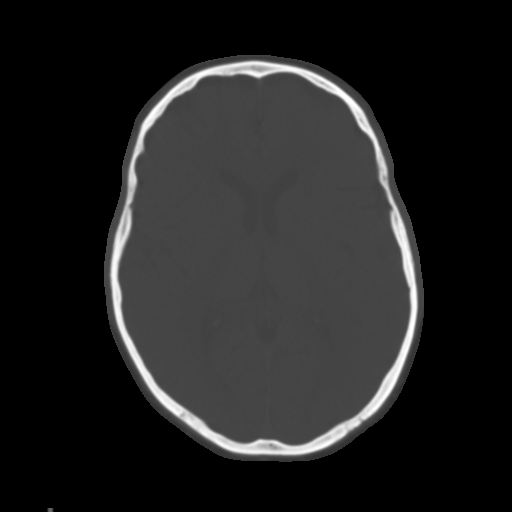
[im 20/33  brain]
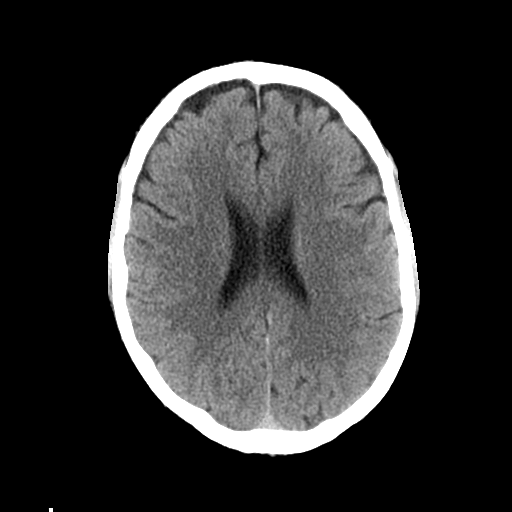
[im 24/33  brain]
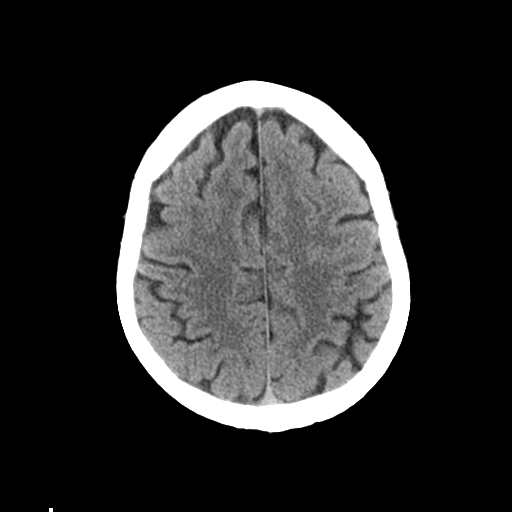
[im 27/33  brain]
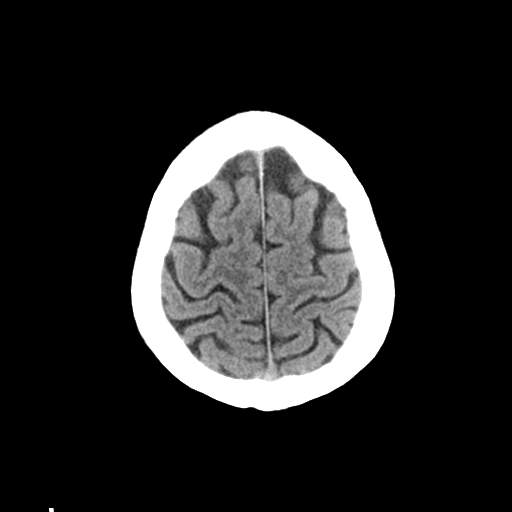
[im 30/33  brain]
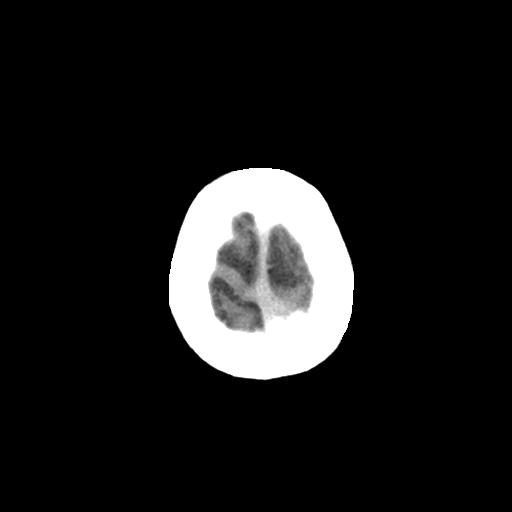
[im 30/33  bone]
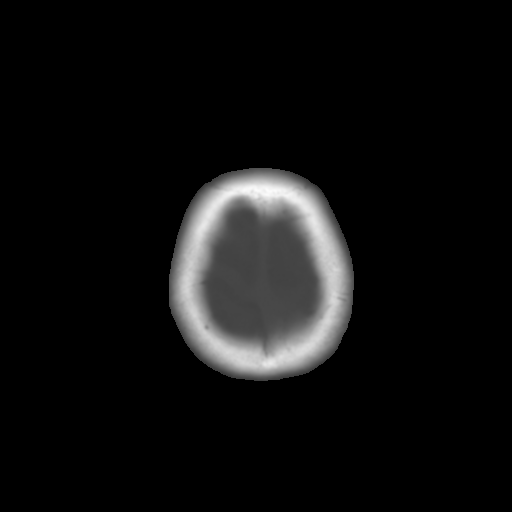

[Series 4: coronal soft · coronal · 0.32mm/px · 3 of 67 slices shown]
[im 23/67  brain]
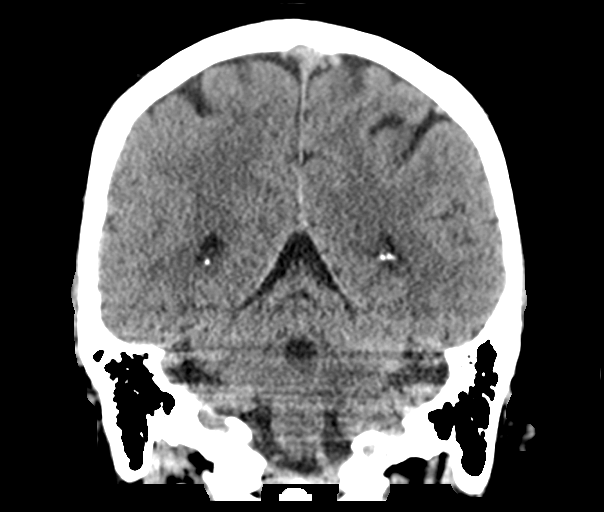
[im 30/67  brain]
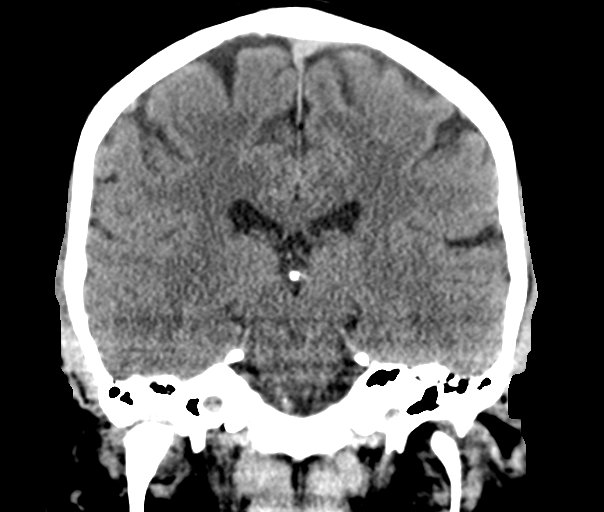
[im 37/67  brain]
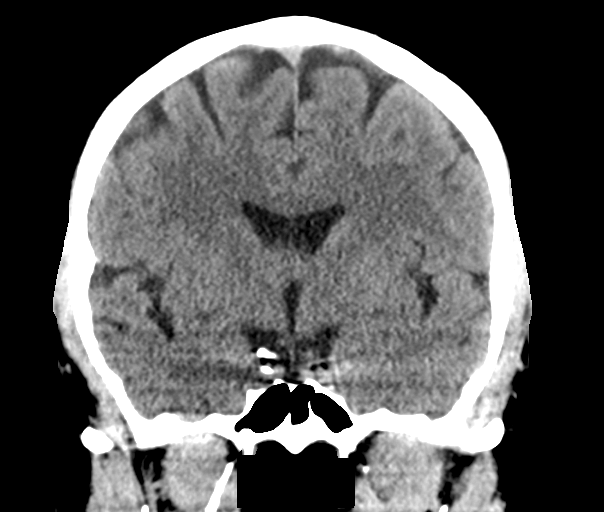

[Series 5: sagittal soft · sagittal · 0.34mm/px · 3 of 60 slices shown]
[im 20/60  brain]
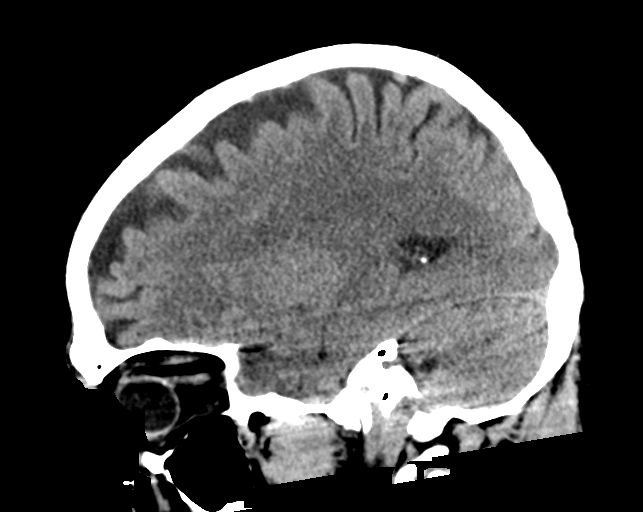
[im 30/60  brain]
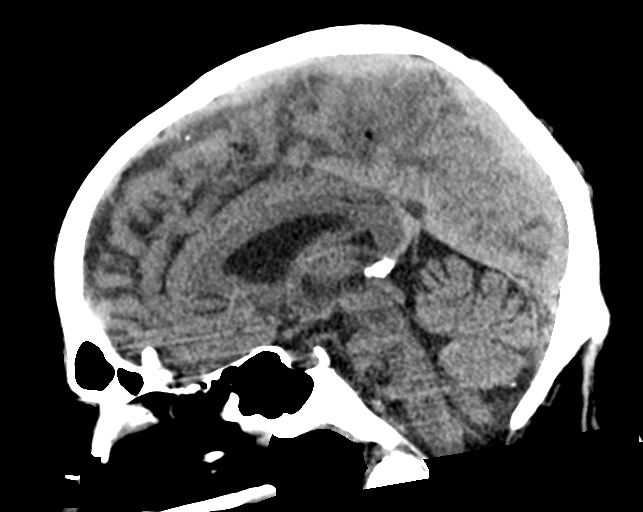
[im 40/60  brain]
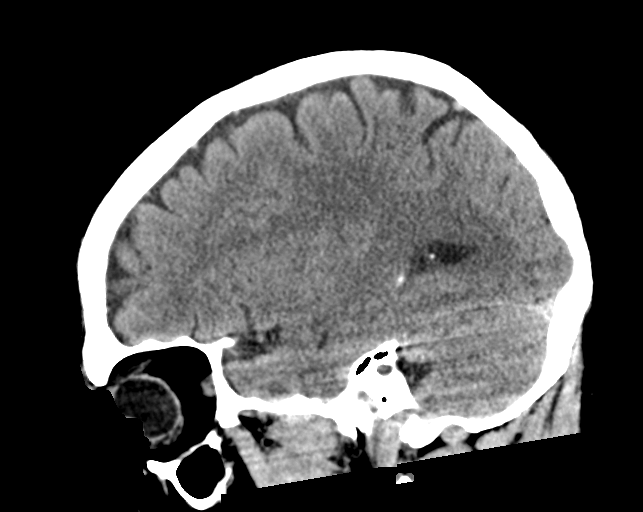

[15 of 47 positions shown; findings below may reference images not displayed]

FINDINGS: Brain:

Cerebral volume is normal.

There is no acute intracranial hemorrhage.

No demarcated cortical infarct.

No extra-axial fluid collection.

No evidence of an intracranial mass.

No midline shift.

Vascular: No hyperdense vessel.  Atherosclerotic calcifications.

Skull: Normal. Negative for fracture or focal lesion.

Sinuses/Orbits: Visualized orbits show no acute finding. No
significant paranasal sinus disease at the imaged levels.

ASPECTS (Alberta Stroke Program Early CT Score)

- Ganglionic level infarction (caudate, lentiform nuclei, internal
capsule, insula, M1-M3 cortex): 7

- Supraganglionic infarction (M4-M6 cortex): 3

Total score (0-10 with 10 being normal): 10

These results were called by telephone at the time of interpretation
on 06/19/2021 at [DATE] to provider Dr. Zizi, who verbally
acknowledged these results.
IMPRESSION: No evidence of acute intracranial abnormality.  ASPECTS is 10.

## 2022-04-19 ENCOUNTER — Ambulatory Visit (INDEPENDENT_AMBULATORY_CARE_PROVIDER_SITE_OTHER): Payer: BC Managed Care – PPO | Admitting: *Deleted

## 2022-04-19 DIAGNOSIS — Z23 Encounter for immunization: Secondary | ICD-10-CM | POA: Diagnosis not present

## 2022-04-27 DIAGNOSIS — G4733 Obstructive sleep apnea (adult) (pediatric): Secondary | ICD-10-CM | POA: Diagnosis not present

## 2022-05-27 DIAGNOSIS — G4733 Obstructive sleep apnea (adult) (pediatric): Secondary | ICD-10-CM | POA: Diagnosis not present

## 2022-07-09 DIAGNOSIS — G4733 Obstructive sleep apnea (adult) (pediatric): Secondary | ICD-10-CM | POA: Diagnosis not present

## 2022-07-25 ENCOUNTER — Encounter: Payer: Self-pay | Admitting: Internal Medicine

## 2022-07-26 ENCOUNTER — Encounter: Payer: Self-pay | Admitting: Internal Medicine

## 2022-07-26 ENCOUNTER — Ambulatory Visit (INDEPENDENT_AMBULATORY_CARE_PROVIDER_SITE_OTHER): Payer: BC Managed Care – PPO | Admitting: Internal Medicine

## 2022-07-26 VITALS — BP 122/84 | HR 69 | Resp 18 | Ht 71.0 in | Wt 205.2 lb

## 2022-07-26 DIAGNOSIS — Z114 Encounter for screening for human immunodeficiency virus [HIV]: Secondary | ICD-10-CM | POA: Diagnosis not present

## 2022-07-26 DIAGNOSIS — Z113 Encounter for screening for infections with a predominantly sexual mode of transmission: Secondary | ICD-10-CM

## 2022-07-26 DIAGNOSIS — Z23 Encounter for immunization: Secondary | ICD-10-CM

## 2022-07-26 NOTE — Progress Notes (Signed)
Acute Office Visit  Subjective:    Patient ID: Stephen [name], male    DOB: 06/01/1961, 61 y.o.   MRN: [id number]  Chief Complaint  Patient presents with   Acute Visit    Pt would like to get std tested wife left 10 days ago and he wants to make sure he has not got any    HPI Patient is in today for STD screening.  He recently separated from from his wife.  He reports that his wife had other sexual partners.  He was sexually active with his wife about 2 weeks ago and did not use any barrier contraceptive.  He currently denies any dysuria, hematuria, urethral discharge, fever, chills, rash.  Past Medical History:  Diagnosis Date   Depression    Hypertension     Past Surgical History:  Procedure Laterality Date   KNEE SURGERY      Family History  Problem Relation Age of Onset   Healthy Mother    Healthy Father     Social History   Socioeconomic History   Marital status: Married    Spouse name: Not on file   Number of children: Not on file   Years of education: Not on file   Highest education level: Not on file  Occupational History   Not on file  Tobacco Use   Smoking status: Never   Smokeless tobacco: Never  Substance and Sexual Activity   Alcohol use: Yes   Drug use: Never   Sexual activity: Not on file  Other Topics Concern   Not on file  Social History Narrative   Not on file   Social Determinants of Health   Financial Resource Strain: Not on file  Food Insecurity: Not on file  Transportation Needs: Not on file  Physical Activity: Not on file  Stress: Not on file  Social Connections: Not on file  Intimate Partner Violence: Not on file    Outpatient Medications Prior to Visit  Medication Sig Dispense Refill   buPROPion (WELLBUTRIN XL) 300 MG 24 hr tablet TAKE (1) TABLET BY MOUTH ONCE DAILY. 90 tablet 3   Cholecalciferol 50 MCG (2000 UT) TABS Take 1 tablet by mouth daily.     Multiple Vitamin (MULTIVITAMIN) tablet Take 1 tablet by mouth  daily.     oxymetazoline (AFRIN) 0.05 % nasal spray Place 2 sprays into both nostrils at bedtime.     vardenafil (LEVITRA) 20 MG tablet TAKE 1/3 TABLET BY MOUTH AS NEEDED FOR ERECTILE DYSFUNCTION. 10 tablet 0   No facility-administered medications prior to visit.    No Known Allergies  Review of Systems  Constitutional:  Negative for chills and fever.  HENT:  Negative for congestion and sore throat.   Eyes:  Negative for pain and discharge.  Respiratory:  Negative for cough and shortness of breath.   Cardiovascular:  Negative for chest pain and palpitations.  Gastrointestinal:  Negative for constipation, diarrhea, nausea and vomiting.  Endocrine: Negative for polydipsia and polyuria.  Genitourinary:  Negative for dysuria and hematuria.  Musculoskeletal:  Negative for neck pain and neck stiffness.  Skin:  Negative for rash.  Neurological:  Negative for dizziness, weakness, numbness and headaches.  Psychiatric/Behavioral:  Negative for agitation and behavioral problems.        Objective:    Physical Exam Vitals reviewed.  Constitutional:      General: He is not in acute distress.    Appearance: He is not diaphoretic.  HENT:  Head: Normocephalic and atraumatic.     Nose: Nose normal.     Mouth/Throat:     Mouth: Mucous membranes are moist.  Eyes:     General: No scleral icterus.    Extraocular Movements: Extraocular movements intact.  Cardiovascular:     Rate and Rhythm: Normal rate and regular rhythm.     Pulses: Normal pulses.     Heart sounds: Normal heart sounds. No murmur heard. Pulmonary:     Breath sounds: Normal breath sounds. No wheezing or rales.  Abdominal:     Palpations: Abdomen is soft.     Tenderness: There is no abdominal tenderness.  Musculoskeletal:     Cervical back: Neck supple. No tenderness.     Right lower leg: No edema.     Left lower leg: No edema.  Skin:    General: Skin is warm.     Findings: No rash.  Neurological:     General: No  focal deficit present.     Mental Status: He is alert and oriented to person, place, and time.     Sensory: No sensory deficit.     Motor: No weakness.  Psychiatric:        Mood and Affect: Mood normal.        Behavior: Behavior normal.     BP 122/84 (BP Location: Right Arm, Patient Position: Sitting, Cuff Size: Normal)   Pulse 69   Resp 18   Ht 5\' 11"  (1.803 m)   Wt 205 lb 3.2 oz (93.1 kg)   SpO2 98%   BMI 28.62 kg/m  Wt Readings from Last 3 Encounters:  07/26/22 205 lb 3.2 oz (93.1 kg)  01/11/22 212 lb 3.2 oz (96.3 kg)  09/07/21 209 lb (94.8 kg)        Assessment & Plan:   Problem List Items Addressed This Visit       Other   Screening for STDs (sexually transmitted diseases) - Primary    Asymptomatic currently Check HIV, hep C, RPR, chlamydia/GC and HSV      Relevant Orders   HIV antibody (with reflex)   Hepatitis C Antibody   Chlamydia/GC NAA, Confirmation   HSV(herpes simplex vrs) 1+2 ab-IgG   RPR w/reflex to TrepSure   Other Visit Diagnoses     Need for immunization against influenza       Relevant Orders   Flu Vaccine QUAD 81mo+IM (Fluarix, Fluzone & Alfiuria Quad PF) (Completed)   Screening for HIV (human immunodeficiency virus)            No orders of the defined types were placed in this encounter.    5mo, MD

## 2022-07-26 NOTE — Assessment & Plan Note (Signed)
Asymptomatic currently Check HIV, hep C, RPR, chlamydia/GC and HSV

## 2022-07-30 LAB — RPR W/REFLEX TO TREPSURE: RPR: NONREACTIVE

## 2022-07-30 LAB — HSV 1 AND 2 AB, IGG
HSV 1 Glycoprotein G Ab, IgG: 58.1 index — ABNORMAL HIGH (ref 0.00–0.90)
HSV 2 IgG, Type Spec: 1.24 index — ABNORMAL HIGH (ref 0.00–0.90)

## 2022-07-30 LAB — HEPATITIS C ANTIBODY: Hep C Virus Ab: NONREACTIVE

## 2022-07-30 LAB — CHLAMYDIA/GC NAA, CONFIRMATION
Chlamydia trachomatis, NAA: NEGATIVE
Neisseria gonorrhoeae, NAA: NEGATIVE

## 2022-07-30 LAB — HIV ANTIBODY (ROUTINE TESTING W REFLEX): HIV Screen 4th Generation wRfx: NONREACTIVE

## 2022-07-30 LAB — T PALLIDUM ANTIBODY, EIA: T pallidum Antibody, EIA: NEGATIVE

## 2022-08-08 ENCOUNTER — Telehealth: Payer: BC Managed Care – PPO | Admitting: Physician Assistant

## 2022-08-08 DIAGNOSIS — G4733 Obstructive sleep apnea (adult) (pediatric): Secondary | ICD-10-CM | POA: Diagnosis not present

## 2022-08-08 DIAGNOSIS — J069 Acute upper respiratory infection, unspecified: Secondary | ICD-10-CM | POA: Diagnosis not present

## 2022-08-08 MED ORDER — BENZONATATE 100 MG PO CAPS: 100.0000 mg | ORAL_CAPSULE | Freq: Three times a day (TID) | ORAL | 0 refills | Status: DC | PRN

## 2022-08-08 MED ORDER — FLUTICASONE PROPIONATE 50 MCG/ACT NA SUSP: 2.0000 | Freq: Every day | NASAL | 0 refills | Status: DC

## 2022-08-08 MED ORDER — IPRATROPIUM BROMIDE 0.03 % NA SOLN: 2.0000 | Freq: Two times a day (BID) | NASAL | 0 refills | Status: DC

## 2022-08-08 NOTE — Progress Notes (Signed)
E-Visit for Upper Respiratory Infection   We are sorry you are not feeling well.  Here is how we plan to help!  Based on what you have shared with me, it looks like you may have a viral upper respiratory infection.  Upper respiratory infections are caused by a large number of viruses; however, rhinovirus is the most common cause.   Symptoms vary from person to person, with common symptoms including sore throat, cough, fatigue or lack of energy and feeling of general discomfort.  A low-grade fever of up to 100.4 may present, but is often uncommon.  Symptoms vary however, and are closely related to a person's age or underlying illnesses.  The most common symptoms associated with an upper respiratory infection are nasal discharge or congestion, cough, sneezing, headache and pressure in the ears and face.  These symptoms usually persist for about 3 to 10 days, but can last up to 2 weeks.  It is important to know that upper respiratory infections do not cause serious illness or complications in most cases.    Upper respiratory infections can be transmitted from person to person, with the most common method of transmission being a person's hands.  The virus is able to live on the skin and can infect other persons for up to 2 hours after direct contact.  Also, these can be transmitted when someone coughs or sneezes; thus, it is important to cover the mouth to reduce this risk.  To keep the spread of the illness at [place], good hand hygiene is very important.  This is an infection that is most likely caused by a virus. There are no specific treatments other than to help you with the symptoms until the infection runs its course.  We are sorry you are not feeling well.  Here is how we plan to help!   For nasal congestion, you may use an oral decongestants such as Mucinex D or if you have glaucoma or high blood pressure use plain Mucinex.  Saline nasal spray or nasal drops can help and can safely be used as often as  needed for congestion.  For your congestion, I have prescribed Fluticasone nasal spray one spray in each nostril twice a day and Ipratropium Bromide nasal spray 0.03% two sprays in each nostril 2-3 times a day  If you do not have a history of heart disease, hypertension, diabetes or thyroid disease, prostate/bladder issues or glaucoma, you may also use Sudafed to treat nasal congestion.  It is highly recommended that you consult with a pharmacist or your primary care physician to ensure this medication is safe for you to take.     If you have a cough, you may use cough suppressants such as Delsym and Robitussin.  If you have glaucoma or high blood pressure, you can also use Coricidin HBP.   For cough I have prescribed for you A prescription cough medication called Tessalon Perles 100 mg. You may take 1-2 capsules every 8 hours as needed for cough  If you have a sore or scratchy throat, use a saltwater gargle-  to  teaspoon of salt dissolved in a 4-ounce to 8-ounce glass of warm water.  Gargle the solution for approximately 15-30 seconds and then spit.  It is important not to swallow the solution.  You can also use throat lozenges/cough drops and Chloraseptic spray to help with throat pain or discomfort.  Warm or cold liquids can also be helpful in relieving throat pain.  For headache, pain or  general discomfort, you can use Ibuprofen or Tylenol as directed.   Some authorities believe that zinc sprays or the use of Echinacea may shorten the course of your symptoms.   HOME CARE Only take medications as instructed by your medical team. Be sure to drink plenty of fluids. Water is fine as well as fruit juices, sodas and electrolyte beverages. You may want to stay away from caffeine or alcohol. If you are nauseated, try taking small sips of liquids. How do you know if you are getting enough fluid? Your urine should be a pale yellow or almost colorless. Get rest. Taking a steamy shower or using a  humidifier may help nasal congestion and ease sore throat pain. You can place a towel over your head and breathe in the steam from hot water coming from a faucet. Using a saline nasal spray works much the same way. Cough drops, hard candies and sore throat lozenges may ease your cough. Avoid close contacts especially the very young and the elderly Cover your mouth if you cough or sneeze Always remember to wash your hands.   GET HELP RIGHT AWAY IF: You develop worsening fever. If your symptoms do not improve within 10 days You develop yellow or green discharge from your nose over 3 days. You have coughing fits You develop a severe head ache or visual changes. You develop shortness of breath, difficulty breathing or start having chest pain Your symptoms persist after you have completed your treatment plan  MAKE SURE YOU  Understand these instructions. Will watch your condition. Will get help right away if you are not doing well or get worse.  Thank you for choosing an e-visit.  Your e-visit answers were reviewed by a board certified advanced clinical practitioner to complete your personal care plan. Depending upon the condition, your plan could have included both over the counter or prescription medications.  Please review your pharmacy choice. Make sure the pharmacy is open so you can pick up prescription now. If there is a problem, you may contact your provider through [employer] and have the prescription routed to another pharmacy.  Your safety is important to [place]. If you have drug allergies check your prescription carefully.   For the next 24 hours you can use MyChart to ask questions about today's visit, request a non-urgent call back, or ask for a work or school excuse. You will get an email in the next two days asking about your experience. I hope that your e-visit has been valuable and will speed your recovery.  I provided 5 minutes of non face-to-face time during this  encounter for chart review and documentation.

## 2022-09-08 DIAGNOSIS — G4733 Obstructive sleep apnea (adult) (pediatric): Secondary | ICD-10-CM | POA: Diagnosis not present

## 2022-09-09 DIAGNOSIS — L820 Inflamed seborrheic keratosis: Secondary | ICD-10-CM | POA: Diagnosis not present

## 2022-09-09 DIAGNOSIS — L814 Other melanin hyperpigmentation: Secondary | ICD-10-CM | POA: Diagnosis not present

## 2022-09-09 DIAGNOSIS — L821 Other seborrheic keratosis: Secondary | ICD-10-CM | POA: Diagnosis not present

## 2022-09-09 DIAGNOSIS — D229 Melanocytic nevi, unspecified: Secondary | ICD-10-CM | POA: Diagnosis not present

## 2022-10-02 ENCOUNTER — Encounter: Payer: Self-pay | Admitting: Internal Medicine

## 2022-10-07 DIAGNOSIS — G4733 Obstructive sleep apnea (adult) (pediatric): Secondary | ICD-10-CM | POA: Diagnosis not present

## 2022-10-10 DIAGNOSIS — G4733 Obstructive sleep apnea (adult) (pediatric): Secondary | ICD-10-CM | POA: Diagnosis not present

## 2022-10-16 ENCOUNTER — Encounter: Payer: Self-pay | Admitting: Physician Assistant

## 2022-10-16 ENCOUNTER — Telehealth: Payer: BC Managed Care – PPO | Admitting: Physician Assistant

## 2022-10-16 DIAGNOSIS — U071 COVID-19: Secondary | ICD-10-CM

## 2022-10-16 DIAGNOSIS — J069 Acute upper respiratory infection, unspecified: Secondary | ICD-10-CM | POA: Diagnosis not present

## 2022-10-16 DIAGNOSIS — Z20822 Contact with and (suspected) exposure to covid-19: Secondary | ICD-10-CM | POA: Diagnosis not present

## 2022-10-16 MED ORDER — FLUTICASONE PROPIONATE 50 MCG/ACT NA SUSP: 2.0000 | Freq: Every day | NASAL | 0 refills | Status: DC

## 2022-10-16 MED ORDER — BENZONATATE 100 MG PO CAPS: 100.0000 mg | ORAL_CAPSULE | Freq: Three times a day (TID) | ORAL | 0 refills | Status: DC | PRN

## 2022-10-16 MED ORDER — MOLNUPIRAVIR EUA 200MG CAPSULE
4.0000 | ORAL_CAPSULE | Freq: Two times a day (BID) | ORAL | 0 refills | Status: AC
  Filled 2022-10-17: qty 40, 5d supply, fill #0

## 2022-10-16 MED ORDER — COVID-19 AT HOME ANTIGEN TEST VI KIT: PACK | 0 refills | Status: DC

## 2022-10-16 NOTE — Progress Notes (Signed)
Virtual Visit Consent   Stephen [name], you are scheduled for a virtual visit with a [facility] provider today. Just as with appointments in the office, your consent must be obtained to participate. Your consent will be active for this visit and any virtual visit you may have with one of our providers in the next 365 days. If you have a MyChart account, a copy of this consent can be sent to you electronically.  As this is a virtual visit, video technology does not allow for your provider to perform a traditional examination. This may limit your provider's ability to fully assess your condition. If your provider identifies any concerns that need to be evaluated in person or the need to arrange testing (such as labs, EKG, etc.), we will make arrangements to do so. Although advances in technology are sophisticated, we cannot ensure that it will always work on either your end or our end. If the connection with a video visit is poor, the visit may have to be switched to a telephone visit. With either a video or telephone visit, we are not always able to ensure that we have a secure connection.  By engaging in this virtual visit, you consent to the provision of healthcare and authorize for your insurance to be billed (if applicable) for the services provided during this visit. Depending on your insurance coverage, you may receive a charge related to this service.  I need to obtain your verbal consent now. Are you willing to proceed with your visit today? Stephen [name] has provided verbal consent on 10/16/2022 for a virtual visit (video or telephone). Stephen [name], [place]  Date: 10/16/2022 10:10 AM  Virtual Visit via Video Note   I, Stephen [name], connected with  Stephen [name]  ([id number], [birth year]) on 10/16/22 at 10:00 AM EST by a video-enabled telemedicine application and verified that I am speaking with the correct person using two identifiers.  Location: Patient: Virtual  Visit Location Patient: Home Provider: Virtual Visit Location Provider: Home Office   I discussed the limitations of evaluation and management by telemedicine and the availability of in person appointments. The patient expressed understanding and agreed to proceed.    History of Present Illness: Stephen [name] is a 61 y.o. who identifies as a male who was assigned male at birth, and is being seen today with 1-1/2 days of nasal congestion, PND, dry cough with occasional headache and fatigue.  Notes this morning feeling slightly off balance with sinus pressure.  Denies vertigo or lightheadedness.  Denies fever but did note some chills.  Was at a family function this past weekend with and now known exposure to [place].  Has not tested at home.  HPI: HPI  Problems:  Patient Active Problem List   Diagnosis Date Noted   Screening for STDs (sexually transmitted diseases) 07/26/2022   Anxiety state 01/11/2022   Benign prostatic hyperplasia with lower urinary tract symptoms 01/11/2022   Chest pain 01/11/2022   Degenerative joint disease of shoulder region 01/11/2022   Hearing loss 01/11/2022   Hesitancy of micturition 01/11/2022   Insomnia 01/11/2022   Major depression, single episode 01/11/2022   Obstructive sleep apnea syndrome 01/11/2022   Personal history of colonic polyps 01/11/2022   Encounter for general adult medical examination with abnormal findings 01/11/2022   Prostate cancer screening 01/11/2022   Erectile dysfunction 09/07/2021   Vertigo    Pre-syncope    TIA (transient ischemic attack)    Generalized anxiety disorder  03/17/2018   Depression, recurrent ([place]) 03/17/2018    Allergies: No Known Allergies Medications:  Current Outpatient Medications:    benzonatate (TESSALON) 100 MG capsule, Take 1 capsule (100 mg total) by mouth 3 (three) times daily as needed for cough., Disp: 30 capsule, Rfl: 0   COVID-19 At Home Antigen Test KIT, Use as directed., Disp: 2 kit, Rfl: 0    fluticasone (FLONASE) 50 MCG/ACT nasal spray, Place 2 sprays into both nostrils daily., Disp: 16 g, Rfl: 0   buPROPion (WELLBUTRIN XL) 300 MG 24 hr tablet, TAKE (1) TABLET BY MOUTH ONCE DAILY., Disp: 90 tablet, Rfl: 3   vardenafil (LEVITRA) 20 MG tablet, TAKE 1/3 TABLET BY MOUTH AS NEEDED FOR ERECTILE DYSFUNCTION., Disp: 10 tablet, Rfl: 0  Observations/Objective: Patient is well-developed, well-nourished in no acute distress.  Resting comfortably at home.  Head is normocephalic, atraumatic.  No labored breathing. Speech is [name] and coherent with logical content.  Patient is alert and oriented at baseline.   Assessment and Plan: 1. Close exposure to COVID-19 virus - COVID-19 At Home Antigen Test KIT; Use as directed.  Dispense: 2 kit; Refill: 0  2. Viral URI with cough - benzonatate (TESSALON) 100 MG capsule; Take 1 capsule (100 mg total) by mouth 3 (three) times daily as needed for cough.  Dispense: 30 capsule; Refill: 0 - fluticasone (FLONASE) 50 MCG/ACT nasal spray; Place 2 sprays into both nostrils daily.  Dispense: 16 g; Refill: 0  Mild URI symptoms at present.  Known exposure to COVID so we will have him test at home.  If positive we will treat accordingly as he would be a candidate for antiviral medication.  If negative will continue treatment as outlined below.  Increase fluids and rest.  Start saline nasal rinse.  Flonase per orders.  OTC Mucinex to help thin congestion.  Tessalon per orders.  Vitamin recommendations reviewed.  MyChart message sent to patient to allow him to directly reply to this provider with COVID results or next steps can be determined.  Follow Up Instructions: I discussed the assessment and treatment plan with the patient. The patient was provided an opportunity to ask questions and all were answered. The patient agreed with the plan and demonstrated an understanding of the instructions.  A copy of instructions were sent to the patient via MyChart unless  otherwise noted below.   The patient was advised to call back or seek an in-person evaluation if the symptoms worsen or if the condition fails to improve as anticipated.  Time:  I spent 12 minutes with the patient via telehealth technology discussing the above problems/concerns.    Stephen Rio, PA-C

## 2022-10-16 NOTE — Patient Instructions (Addendum)
Stephen [name], thank you for joining [name], PA-C for today's virtual visit.  While this provider is not your primary care provider (PCP), if your PCP is located in our provider database this encounter information will be shared with them immediately following your visit.   A [facility] MyChart account gives you access to today's visit and all your visits, tests, and labs performed at [facility] " click here if you don't have a [facility] MyChart account or go to [email or web address]  Consent: (Patient) Stephen [name] provided verbal consent for this virtual visit at the beginning of the encounter.  Current Medications:  Current Outpatient Medications:    benzonatate (TESSALON) 100 MG capsule, Take 1 capsule (100 mg total) by mouth 3 (three) times daily as needed., Disp: 30 capsule, Rfl: 0   buPROPion (WELLBUTRIN XL) 300 MG 24 hr tablet, TAKE (1) TABLET BY MOUTH ONCE DAILY., Disp: 90 tablet, Rfl: 3   Cholecalciferol 50 MCG (2000 UT) TABS, Take 1 tablet by mouth daily., Disp: , Rfl:    fluticasone (FLONASE) 50 MCG/ACT nasal spray, Place 2 sprays into both nostrils daily., Disp: 16 g, Rfl: 0   ipratropium (ATROVENT) 0.03 % nasal spray, Place 2 sprays into both nostrils every 12 (twelve) hours., Disp: 30 mL, Rfl: 0   Multiple Vitamin (MULTIVITAMIN) tablet, Take 1 tablet by mouth daily., Disp: , Rfl:    oxymetazoline (AFRIN) 0.05 % nasal spray, Place 2 sprays into both nostrils at bedtime., Disp: , Rfl:    vardenafil (LEVITRA) 20 MG tablet, TAKE 1/3 TABLET BY MOUTH AS NEEDED FOR ERECTILE DYSFUNCTION., Disp: 10 tablet, Rfl: 0   Medications ordered in this encounter:  No orders of the defined types were placed in this encounter.    *If you need refills on other medications prior to your next appointment, please contact your pharmacy*  Follow-Up: Call back or seek an in-person evaluation if the symptoms worsen or if the condition fails to improve as  anticipated.  [facility] [phone]  Other Instructions  Based on what you have shared with me, it looks like you may have a viral upper respiratory infection.  Upper respiratory infections are caused by a large number of viruses; however, rhinovirus is the most common cause. Giving your health history and possible COVID exposure, I want you to take the home COVID test as discussed so we can determine need for antivirals, etc. For now, follow the care plan below. Once you have your result, message me back via MyChart as discussed.  Symptoms vary from person to person, with common symptoms including sore throat, cough, fatigue or lack of energy and feeling of general discomfort.  A low-grade fever of up to 100.4 may present, but is often uncommon.  Symptoms vary however, and are closely related to a person's age or underlying illnesses.  The most common symptoms associated with an upper respiratory infection are nasal discharge or congestion, cough, sneezing, headache and pressure in the ears and face.  These symptoms usually persist for about 3 to 10 days, but can last up to 2 weeks.  It is important to know that upper respiratory infections do not cause serious illness or complications in most cases.    Upper respiratory infections can be transmitted from person to person, with the most common method of transmission being a person's hands.  The virus is able to live on the skin and can infect other persons for up to 2 hours after direct  contact.  Also, these can be transmitted when someone coughs or sneezes; thus, it is important to cover the mouth to reduce this risk.  To keep the spread of the illness at bay, good hand hygiene is very important.  This is an infection that is most likely caused by a virus. There are no specific treatments other than to help you with the symptoms until the infection runs its course.  We are sorry you are not feeling well.  Here is how we plan to  help!   For nasal congestion, you may use an oral decongestants such as Mucinex D or if you have glaucoma or high blood pressure use plain Mucinex.  Saline nasal spray or nasal drops can help and can safely be used as often as needed for congestion.  For your congestion, I have prescribed Fluticasone nasal spray one spray in each nostril twice a day  If you do not have a history of heart disease, hypertension, diabetes or thyroid disease, prostate/bladder issues or glaucoma, you may also use Sudafed to treat nasal congestion.  It is highly recommended that you consult with a pharmacist or your primary care physician to ensure this medication is safe for you to take.     If you have a cough, you may use cough suppressants such as Delsym and Robitussin.  If you have glaucoma or high blood pressure, you can also use Coricidin HBP.   For cough I have prescribed for you A prescription cough medication called Tessalon Perles 100 mg. You may take 1-2 capsules every 8 hours as needed for cough  If you have a sore or scratchy throat, use a saltwater gargle-  to  teaspoon of salt dissolved in a 4-ounce to 8-ounce glass of warm water.  Gargle the solution for approximately 15-30 seconds and then spit.  It is important not to swallow the solution.  You can also use throat lozenges/cough drops and Chloraseptic spray to help with throat pain or discomfort.  Warm or cold liquids can also be helpful in relieving throat pain.  For headache, pain or general discomfort, you can use Ibuprofen or Tylenol as directed.   Some authorities believe that zinc sprays or the use of Echinacea may shorten the course of your symptoms.   HOME CARE Only take medications as instructed by your medical team. Be sure to drink plenty of fluids. Water is fine as well as fruit juices, sodas and electrolyte beverages. You may want to stay away from caffeine or alcohol. If you are nauseated, try taking small sips of liquids. How do you  know if you are getting enough fluid? Your urine should be a pale yellow or almost colorless. Get rest. Taking a steamy shower or using a humidifier may help nasal congestion and ease sore throat pain. You can place a towel over your head and breathe in the steam from hot water coming from a faucet. Using a saline nasal spray works much the same way. Cough drops, hard candies and sore throat lozenges may ease your cough. Avoid close contacts especially the very young and the elderly Cover your mouth if you cough or sneeze Always remember to wash your hands.   GET HELP RIGHT AWAY IF: You develop worsening fever. If your symptoms do not improve within 10 days You develop yellow or green discharge from your nose over 3 days. You have coughing fits You develop a severe head ache or visual changes. You develop shortness of breath, difficulty breathing or  start having chest pain Your symptoms persist after you have completed your treatment plan  MAKE SURE YOU  Understand these instructions. Will watch your condition. Will get help right away if you are not doing well or get worse.   If you have been instructed to have an in-person evaluation today at a local Urgent Care facility, please use the link below. It will take you to a list of all of our available [facility], including address, phone number and hours of operation. Please do not delay care.  [facility]  If you or a family member do not have a primary care provider, use the link below to schedule a visit and establish care. When you choose a [facility] primary care physician or advanced practice provider, you gain a long-term partner in health. Find a Primary Care Provider  Learn more about [facility] in-office and virtual care options: [facility] - Get Care Now

## 2022-10-17 ENCOUNTER — Other Ambulatory Visit (HOSPITAL_COMMUNITY): Payer: Self-pay

## 2022-10-17 ENCOUNTER — Telehealth: Payer: Self-pay

## 2022-10-17 NOTE — Telephone Encounter (Signed)
Patient returned call-  Patient is presently treating symptoms and has not started his antiviral yet- will pick up today. Advised: Cough: Treat cough- OTC medications, drink soothing fluids and continue prescribed medications. Contact provider if gets worse- can't sleep at night, becomes productive with yellow/green sputum. Weakness: fluids, eat, rest- Contact provider if gets worse- inability to stand/holding on to something to maintain balance Encouraged to continue to fill out [facility] for progress monitoring and contact PCP with worsening symptoms

## 2022-10-17 NOTE — Telephone Encounter (Signed)
Called patient in regard to the COVID questionnaire filled out in my chart. No answer. Left VM to call back with any questions.

## 2022-10-22 DIAGNOSIS — L820 Inflamed seborrheic keratosis: Secondary | ICD-10-CM | POA: Diagnosis not present

## 2022-11-07 DIAGNOSIS — G4733 Obstructive sleep apnea (adult) (pediatric): Secondary | ICD-10-CM | POA: Diagnosis not present

## 2022-11-13 DIAGNOSIS — G4733 Obstructive sleep apnea (adult) (pediatric): Secondary | ICD-10-CM | POA: Diagnosis not present

## 2022-12-08 DIAGNOSIS — G4733 Obstructive sleep apnea (adult) (pediatric): Secondary | ICD-10-CM | POA: Diagnosis not present

## 2023-01-17 ENCOUNTER — Ambulatory Visit (INDEPENDENT_AMBULATORY_CARE_PROVIDER_SITE_OTHER): Payer: BC Managed Care – PPO | Admitting: Internal Medicine

## 2023-01-17 ENCOUNTER — Encounter: Payer: Self-pay | Admitting: Internal Medicine

## 2023-01-17 VITALS — BP 124/76 | HR 72 | Ht 71.0 in | Wt 202.6 lb

## 2023-01-17 DIAGNOSIS — R739 Hyperglycemia, unspecified: Secondary | ICD-10-CM | POA: Diagnosis not present

## 2023-01-17 DIAGNOSIS — G4733 Obstructive sleep apnea (adult) (pediatric): Secondary | ICD-10-CM | POA: Diagnosis not present

## 2023-01-17 DIAGNOSIS — E559 Vitamin D deficiency, unspecified: Secondary | ICD-10-CM | POA: Diagnosis not present

## 2023-01-17 DIAGNOSIS — E782 Mixed hyperlipidemia: Secondary | ICD-10-CM

## 2023-01-17 DIAGNOSIS — F411 Generalized anxiety disorder: Secondary | ICD-10-CM

## 2023-01-17 DIAGNOSIS — F339 Major depressive disorder, recurrent, unspecified: Secondary | ICD-10-CM | POA: Diagnosis not present

## 2023-01-17 DIAGNOSIS — Z125 Encounter for screening for malignant neoplasm of prostate: Secondary | ICD-10-CM

## 2023-01-17 DIAGNOSIS — Z0001 Encounter for general adult medical examination with abnormal findings: Secondary | ICD-10-CM | POA: Diagnosis not present

## 2023-01-17 MED ORDER — BUPROPION HCL ER (XL) 300 MG PO TB24: ORAL_TABLET | ORAL | 3 refills | Status: DC

## 2023-01-17 NOTE — Assessment & Plan Note (Signed)
Uses CPAP regularly °

## 2023-01-17 NOTE — Patient Instructions (Signed)
Please continue to take medications as prescribed. ? ?Please continue to follow heart healthy diet and perform moderate exercise/walking at least 150 mins/week. ?

## 2023-01-17 NOTE — Progress Notes (Signed)
Established Patient Office Visit  Subjective:  Patient ID: Stephen [name], male    DOB: [birth year]  Age: 62 y.o. MRN: [id number]  CC:  Chief Complaint  Patient presents with   Annual Exam    HPI Stephen [name] is a 62 y.o. male with past medical history of depression, anxiety and erectile dysfunction who presents for annual physical.  He takes Wellbutrin for depression and anxiety.  He has tried Zoloft and Lexapro in the past.  He received BH therapy in the past as well.  Denies any anhedonia, SI or HI currently.  He takes Levitra as needed for ED.  Denies any dysuria or hematuria currently.   Past Medical History:  Diagnosis Date   Depression    Hypertension     Past Surgical History:  Procedure Laterality Date   KNEE SURGERY      Family History  Problem Relation Age of Onset   Healthy Mother    Healthy Father     Social History   Socioeconomic History   Marital status: Legally Separated    Spouse name: Not on file   Number of children: Not on file   Years of education: Not on file   Highest education level: Not on file  Occupational History   Not on file  Tobacco Use   Smoking status: Never   Smokeless tobacco: Never  Substance and Sexual Activity   Alcohol use: Yes   Drug use: Never   Sexual activity: Not on file  Other Topics Concern   Not on file  Social History Narrative   Not on file   Social Determinants of Health   Financial Resource Strain: Not on file  Food Insecurity: Not on file  Transportation Needs: Not on file  Physical Activity: Not on file  Stress: Not on file  Social Connections: Not on file  Intimate Partner Violence: Not on file    Outpatient Medications Prior to Visit  Medication Sig Dispense Refill   benzonatate (TESSALON) 100 MG capsule Take 1 capsule (100 mg total) by mouth 3 (three) times daily as needed for cough. 30 capsule 0   fluticasone (FLONASE) 50 MCG/ACT nasal spray Place 2 sprays into both nostrils  daily. 16 g 0   vardenafil (LEVITRA) 20 MG tablet TAKE 1/3 TABLET BY MOUTH AS NEEDED FOR ERECTILE DYSFUNCTION. 10 tablet 0   buPROPion (WELLBUTRIN XL) 300 MG 24 hr tablet TAKE (1) TABLET BY MOUTH ONCE DAILY. 90 tablet 3   COVID-19 At Home Antigen Test KIT Use as directed. 2 kit 0   No facility-administered medications prior to visit.    No Known Allergies  ROS Review of Systems  Constitutional:  Negative for chills and fever.  HENT:  Negative for congestion and sore throat.   Eyes:  Negative for pain and discharge.  Respiratory:  Negative for cough and shortness of breath.   Cardiovascular:  Negative for chest pain and palpitations.  Gastrointestinal:  Negative for constipation, diarrhea, nausea and vomiting.  Endocrine: Negative for polydipsia and polyuria.  Genitourinary:  Negative for dysuria and hematuria.  Musculoskeletal:  Negative for neck pain and neck stiffness.  Skin:  Negative for rash.  Neurological:  Negative for dizziness, weakness, numbness and headaches.  Psychiatric/Behavioral:  Negative for agitation and behavioral problems.       Objective:    Physical Exam Vitals reviewed.  Constitutional:      General: He is not in acute distress.    Appearance: He is not diaphoretic.  HENT:     Head: Normocephalic and atraumatic.     Nose: Nose normal.     Mouth/Throat:     Mouth: Mucous membranes are moist.  Eyes:     General: No scleral icterus.    Extraocular Movements: Extraocular movements intact.  Cardiovascular:     Rate and Rhythm: Normal rate and regular rhythm.     Pulses: Normal pulses.     Heart sounds: Normal heart sounds. No murmur heard. Pulmonary:     Breath sounds: Normal breath sounds. No wheezing or rales.  Abdominal:     Palpations: Abdomen is soft.     Tenderness: There is no abdominal tenderness.  Musculoskeletal:     Cervical back: Neck supple. No tenderness.     Right lower leg: No edema.     Left lower leg: No edema.  Skin:     General: Skin is warm.     Findings: No rash.  Neurological:     General: No focal deficit present.     Mental Status: He is alert and oriented to person, place, and time.     Cranial Nerves: No cranial nerve deficit.     Sensory: No sensory deficit.     Motor: No weakness.  Psychiatric:        Mood and Affect: Mood normal.        Behavior: Behavior normal.     BP 124/76 (BP Location: Left Arm, Patient Position: Sitting, Cuff Size: Large)   Pulse 72   Ht 5\' 11"  (1.803 m)   Wt 202 lb 9.6 oz (91.9 kg)   SpO2 98%   BMI 28.26 kg/m  Wt Readings from Last 3 Encounters:  01/17/23 202 lb 9.6 oz (91.9 kg)  07/26/22 205 lb 3.2 oz (93.1 kg)  01/11/22 212 lb 3.2 oz (96.3 kg)    Lab Results  Component Value Date   TSH 1.350 01/11/2022   Lab Results  Component Value Date   WBC 6.0 01/11/2022   HGB 15.4 01/11/2022   HCT 46.1 01/11/2022   MCV 92 01/11/2022   PLT 282 01/11/2022   Lab Results  Component Value Date   NA 141 01/11/2022   K 5.0 01/11/2022   CO2 23 01/11/2022   GLUCOSE 82 01/11/2022   BUN 14 01/11/2022   CREATININE 1.28 (H) 01/11/2022   BILITOT 0.5 01/11/2022   ALKPHOS 72 01/11/2022   AST 19 01/11/2022   ALT 18 01/11/2022   PROT 7.6 01/11/2022   ALBUMIN 4.9 01/11/2022   CALCIUM 9.8 01/11/2022   ANIONGAP 6 06/20/2021   EGFR 64 01/11/2022   Lab Results  Component Value Date   CHOL 163 01/11/2022   Lab Results  Component Value Date   HDL 46 01/11/2022   Lab Results  Component Value Date   LDLCALC 94 01/11/2022   Lab Results  Component Value Date   TRIG 129 01/11/2022   Lab Results  Component Value Date   CHOLHDL 3.5 01/11/2022   Lab Results  Component Value Date   HGBA1C 5.3 01/11/2022      Assessment & Plan:   Problem List Items Addressed This Visit    Obstructive sleep apnea syndrome Uses CPAP regularly.  Depression, recurrent ([place]) [place] Office Visit from 01/17/2023 in [facility]  PHQ-2 Total Score  1      Well-controlled with Wellbutrin Has tried Zoloft and Lexapro in the past  Prostate cancer screening Ordered PSA after discussing its limitations for prostate cancer  screening, including false positive results leading to additional investigations.  Encounter for general adult medical examination with abnormal findings Physical exam as documented. Fasting blood tests today.      Meds ordered this encounter  Medications   buPROPion (WELLBUTRIN XL) 300 MG 24 hr tablet    Sig: TAKE (1) TABLET BY MOUTH ONCE DAILY.    Dispense:  90 tablet    Refill:  3    Follow-up: Return in about 6 months (around 07/20/2023) for MDD.    Lindell Spar, MD

## 2023-01-17 NOTE — Assessment & Plan Note (Signed)
Physical exam as documented. Fasting blood tests today. 

## 2023-01-17 NOTE — Assessment & Plan Note (Signed)
[facility] Office Visit from 01/17/2023 in [facility]  PHQ-2 Total Score 1      Well-controlled with Wellbutrin Has tried Zoloft and Lexapro in the past

## 2023-01-17 NOTE — Assessment & Plan Note (Addendum)
Ordered PSA after discussing its limitations for prostate cancer screening, including false positive results leading to additional investigations. 

## 2023-01-18 LAB — CBC WITH DIFFERENTIAL/PLATELET
Basophils Absolute: 0.1 10*3/uL (ref 0.0–0.2)
Basos: 1 %
EOS (ABSOLUTE): 0.1 10*3/uL (ref 0.0–0.4)
Eos: 2 %
Hematocrit: 43.4 % (ref 37.5–51.0)
Hemoglobin: 14.7 g/dL (ref 13.0–17.7)
Immature Grans (Abs): 0 10*3/uL (ref 0.0–0.1)
Immature Granulocytes: 0 %
Lymphocytes Absolute: 1.5 10*3/uL (ref 0.7–3.1)
Lymphs: 23 %
MCH: 31.3 pg (ref 26.6–33.0)
MCHC: 33.9 g/dL (ref 31.5–35.7)
MCV: 93 fL (ref 79–97)
Monocytes Absolute: 0.5 10*3/uL (ref 0.1–0.9)
Monocytes: 7 %
Neutrophils Absolute: 4.5 10*3/uL (ref 1.4–7.0)
Neutrophils: 67 %
Platelets: 292 10*3/uL (ref 150–450)
RBC: 4.69 x10E6/uL (ref 4.14–5.80)
RDW: 12.5 % (ref 11.6–15.4)
WBC: 6.7 10*3/uL (ref 3.4–10.8)

## 2023-01-18 LAB — TSH: TSH: 1.41 u[IU]/mL (ref 0.450–4.500)

## 2023-01-18 LAB — CMP14+EGFR
ALT: 24 IU/L (ref 0–44)
AST: 22 IU/L (ref 0–40)
Albumin/Globulin Ratio: 1.9 (ref 1.2–2.2)
Albumin: 4.8 g/dL (ref 3.9–4.9)
Alkaline Phosphatase: 71 IU/L (ref 44–121)
BUN/Creatinine Ratio: 8 — ABNORMAL LOW (ref 10–24)
BUN: 11 mg/dL (ref 8–27)
Bilirubin Total: 0.5 mg/dL (ref 0.0–1.2)
CO2: 22 mmol/L (ref 20–29)
Calcium: 10 mg/dL (ref 8.6–10.2)
Chloride: 103 mmol/L (ref 96–106)
Creatinine, Ser: 1.4 mg/dL — ABNORMAL HIGH (ref 0.76–1.27)
Globulin, Total: 2.5 g/dL (ref 1.5–4.5)
Glucose: 85 mg/dL (ref 70–99)
Potassium: 4.9 mmol/L (ref 3.5–5.2)
Sodium: 140 mmol/L (ref 134–144)
Total Protein: 7.3 g/dL (ref 6.0–8.5)
eGFR: 57 mL/min/{1.73_m2} — ABNORMAL LOW (ref >59)

## 2023-01-18 LAB — HEMOGLOBIN A1C
Est. average glucose Bld gHb Est-mCnc: 105 mg/dL
Hgb A1c MFr Bld: 5.3 % (ref 4.8–5.6)

## 2023-01-18 LAB — LIPID PANEL
Chol/HDL Ratio: 3 ratio (ref 0.0–5.0)
Cholesterol, Total: 160 mg/dL (ref 100–199)
HDL: 54 mg/dL (ref >39)
LDL Chol Calc (NIH): 88 mg/dL (ref 0–99)
Triglycerides: 100 mg/dL (ref 0–149)
VLDL Cholesterol Cal: 18 mg/dL (ref 5–40)

## 2023-01-18 LAB — VITAMIN D 25 HYDROXY (VIT D DEFICIENCY, FRACTURES): Vit D, 25-Hydroxy: 56.9 ng/mL (ref 30.0–100.0)

## 2023-01-18 LAB — PSA: Prostate Specific Ag, Serum: 1 ng/mL (ref 0.0–4.0)

## 2023-02-04 DIAGNOSIS — G4733 Obstructive sleep apnea (adult) (pediatric): Secondary | ICD-10-CM | POA: Diagnosis not present

## 2023-02-17 ENCOUNTER — Other Ambulatory Visit: Payer: Self-pay | Admitting: Internal Medicine

## 2023-02-17 ENCOUNTER — Encounter: Payer: Self-pay | Admitting: Internal Medicine

## 2023-02-17 DIAGNOSIS — N529 Male erectile dysfunction, unspecified: Secondary | ICD-10-CM

## 2023-02-17 MED ORDER — TADALAFIL 10 MG PO TABS: 10.0000 mg | ORAL_TABLET | ORAL | 1 refills | Status: DC | PRN

## 2023-02-24 DIAGNOSIS — L820 Inflamed seborrheic keratosis: Secondary | ICD-10-CM | POA: Diagnosis not present

## 2023-03-04 DIAGNOSIS — G4733 Obstructive sleep apnea (adult) (pediatric): Secondary | ICD-10-CM | POA: Diagnosis not present

## 2023-03-06 DIAGNOSIS — G4733 Obstructive sleep apnea (adult) (pediatric): Secondary | ICD-10-CM | POA: Diagnosis not present

## 2023-03-28 ENCOUNTER — Telehealth: Payer: BC Managed Care – PPO | Admitting: Nurse Practitioner

## 2023-03-28 DIAGNOSIS — J0140 Acute pansinusitis, unspecified: Secondary | ICD-10-CM | POA: Diagnosis not present

## 2023-03-28 MED ORDER — AMOXICILLIN-POT CLAVULANATE 875-125 MG PO TABS: 1.0000 | ORAL_TABLET | Freq: Two times a day (BID) | ORAL | 0 refills | Status: AC

## 2023-03-28 MED ORDER — FLUTICASONE PROPIONATE 50 MCG/ACT NA SUSP: 2.0000 | Freq: Every day | NASAL | 6 refills | Status: AC

## 2023-03-28 NOTE — Progress Notes (Signed)
Virtual Visit Consent   Rexton Helser Som, you are scheduled for a virtual visit with a [facility] provider today. Just as with appointments in the office, your consent must be obtained to participate. Your consent will be active for this visit and any virtual visit you may have with one of our providers in the next 365 days. If you have a MyChart account, a copy of this consent can be sent to you electronically.  As this is a virtual visit, video technology does not allow for your provider to perform a traditional examination. This may limit your provider's ability to fully assess your condition. If your provider identifies any concerns that need to be evaluated in person or the need to arrange testing (such as labs, EKG, etc.), we will make arrangements to do so. Although advances in technology are sophisticated, we cannot ensure that it will always work on either your end or our end. If the connection with a video visit is poor, the visit may have to be switched to a telephone visit. With either a video or telephone visit, we are not always able to ensure that we have a secure connection.  By engaging in this virtual visit, you consent to the provision of healthcare and authorize for your insurance to be billed (if applicable) for the services provided during this visit. Depending on your insurance coverage, you may receive a charge related to this service.  I need to obtain your verbal consent now. Are you willing to proceed with your visit today? Stephen [name] has provided verbal consent on 03/28/2023 for a virtual visit (video or telephone). Viviano Simas, FNP  Date: 03/28/2023 8:14 AM  Virtual Visit via Video Note   I, Viviano Simas, connected with  Stephen [name]  ([id number], [birth year]) on 03/28/23 at  8:15 AM EDT by a video-enabled telemedicine application and verified that I am speaking with the correct person using two identifiers.  Location: Patient: Virtual Visit Location  Patient: Home Provider: Virtual Visit Location Provider: Home Office   I discussed the limitations of evaluation and management by telemedicine and the availability of in person appointments. The patient expressed understanding and agreed to proceed.    History of Present Illness: Stephen [name] is a 62 y.o. who identifies as a male who was assigned male at birth, and is being seen today for sore throat .  Symptom onset was over the past weekend  Started with burning eyes and sneezing  He was traveling to [place] at that time  Daughter has similar symptoms currently and tested negative for COVID Flew back 5 days ago  Symptoms have progressed   Sinus pressure has worsened  Teeth tenderness on the top  Fatigue   He has not been tested for COVID   He has been taking Mucinex for relief  Nyquil last night   Denies any cough or chest congestion   Problems:  Patient Active Problem List   Diagnosis Date Noted   Screening for STDs (sexually transmitted diseases) 07/26/2022   Anxiety state 01/11/2022   Benign prostatic hyperplasia with lower urinary tract symptoms 01/11/2022   Chest pain 01/11/2022   Degenerative joint disease of shoulder region 01/11/2022   Hearing loss 01/11/2022   Hesitancy of micturition 01/11/2022   Insomnia 01/11/2022   Obstructive sleep apnea syndrome 01/11/2022   Personal history of colonic polyps 01/11/2022   Encounter for general adult medical examination with abnormal findings 01/11/2022   Prostate cancer screening 01/11/2022   Erectile  dysfunction 09/07/2021   Vertigo    Pre-syncope    TIA (transient ischemic attack)    Generalized anxiety disorder 03/17/2018   Depression, recurrent (HCC) 03/17/2018    Allergies: No Known Allergies Medications:  Current Outpatient Medications:    benzonatate (TESSALON) 100 MG capsule, Take 1 capsule (100 mg total) by mouth 3 (three) times daily as needed for cough., Disp: 30 capsule, Rfl: 0   buPROPion  (WELLBUTRIN XL) 300 MG 24 hr tablet, TAKE (1) TABLET BY MOUTH ONCE DAILY., Disp: 90 tablet, Rfl: 3   fluticasone (FLONASE) 50 MCG/ACT nasal spray, Place 2 sprays into both nostrils daily., Disp: 16 g, Rfl: 0   tadalafil (CIALIS) 10 MG tablet, Take 1 tablet (10 mg total) by mouth every other day as needed for erectile dysfunction., Disp: 10 tablet, Rfl: 1  Observations/Objective: Patient is well-developed, well-nourished in no acute distress.  Resting comfortably  at home.  Head is normocephalic, atraumatic.  No labored breathing.  Speech is clear and coherent with logical content.  Patient is alert and oriented at baseline.    Assessment and Plan: 1. Acute non-recurrent pansinusitis Plush fluids assure protein intake and rest   - fluticasone (FLONASE) 50 MCG/ACT nasal spray; Place 2 sprays into both nostrils daily.  Dispense: 16 g; Refill: 6 - amoxicillin-clavulanate (AUGMENTIN) 875-125 MG tablet; Take 1 tablet by mouth 2 (two) times daily for 7 days. Take with food  Dispense: 14 tablet; Refill: 0     Follow Up Instructions: I discussed the assessment and treatment plan with the patient. The patient was provided an opportunity to ask questions and all were answered. The patient agreed with the plan and demonstrated an understanding of the instructions.  A copy of instructions were sent to the patient via MyChart unless otherwise noted below.    The patient was advised to call back or seek an in-person evaluation if the symptoms worsen or if the condition fails to improve as anticipated.  Time:  I spent 15 minutes with the patient via telehealth technology discussing the above problems/concerns.    Viviano Simas, FNP

## 2023-04-02 ENCOUNTER — Other Ambulatory Visit: Payer: Self-pay | Admitting: Internal Medicine

## 2023-04-02 DIAGNOSIS — N529 Male erectile dysfunction, unspecified: Secondary | ICD-10-CM

## 2023-04-03 DIAGNOSIS — G4733 Obstructive sleep apnea (adult) (pediatric): Secondary | ICD-10-CM | POA: Diagnosis not present

## 2023-04-06 ENCOUNTER — Telehealth: Payer: BC Managed Care – PPO | Admitting: Physician Assistant

## 2023-04-06 DIAGNOSIS — R21 Rash and other nonspecific skin eruption: Secondary | ICD-10-CM | POA: Diagnosis not present

## 2023-04-06 DIAGNOSIS — W57XXXA Bitten or stung by nonvenomous insect and other nonvenomous arthropods, initial encounter: Secondary | ICD-10-CM

## 2023-04-06 NOTE — Progress Notes (Signed)
E-Visit for Tick Bite  Thank you for describing your tick bite, Here is how we plan to help! Based on the information that you shared with me it looks like you have An uncomplicated tick bite that just occurred and can be closely follow using the instructions in your care plan.  In most cases a tick bite is painless and does not itch.  Most tick bites in which the tick is quickly removed do not require prescriptions. Ticks can transmit several diseases if they are infected and remain attacked to your skin. Therefore the length that the tick was attached and any symptoms you have experienced after the bite are import to accurately develop your custom treatment plan. In most cases a single dose of doxycycline may prevent the development of a more serious condition. This would need to be started 72 hours after tick bite.   Based on your information I have Provided a home care guide for tick bites and  instructions on when to call for help.  Which ticks  are associated with illness?  The Wood Tick (dog tick) is the size of a watermelon seed and can sometimes transmit [facility] spotted fever and [place] tick fever.   The Deer Tick (black-legged tick) is between the size of a poppy seed (pin head) and an apple seed, and can sometimes transmit Lyme disease.  A brown to black tick with a white splotch on its back is likely a male Amblyomma americanum ([place] tick). This tick has been associated with Southern Tick Associated illness ( STARI)  Lyme disease has become the most common tick-borne illness in the [place]. The risk of Lyme disease following a recognized deer tick bite is estimated to be 1%.  The majority of cases of Lyme disease start with a bull's eye rash at the site of the tick bite. The rash can occur days to weeks (typically 7-10 days) after a tick bite. Treatment with antibiotics is indicated if this rash appears. Flu-like symptoms may accompany the rash, including:  fever, chills, headaches, muscle aches, and fatigue. Removing ticks promptly may prevent tick borne disease.  What can be used to prevent Tick Bites?  Insect repellant with at leas 20% DEET. Wearing long pants with sock and shoes. Avoiding tall grass and heavily wooded areas. Checking your skin after being outdoors. Shower with a washcloth after outdoor exposures.  HOME CARE ADVICE FOR TICK BITE  Wood Tick Removal:  Use a pair of tweezers and grasp the wood tick close to the skin (on its head). Pull the wood tick straight upward without twisting or crushing it. Maintain a steady pressure until it releases its grip.   If tweezers aren't available, use fingers, a loop of thread around the jaws, or a needle between the jaws for traction.  Note: covering the tick with petroleum jelly, nail polish or rubbing alcohol doesn't work. Neither does touching the tick with a hot or cold object. Tiny Deer Tick Removal:   Needs to be scraped off with a knife blade or credit card edge. Place tick in a sealed container (e.g. glass jar, zip lock plastic bag), in case your doctor wants to see it. Tick's Head Removal:  If the wood tick's head breaks off in the skin, it must be removed. Clean the skin. Then use a sterile needle to uncover the head and lift it out or scrape it off.  If a very small piece of the head remains, the skin will eventually slough  it off. Antibiotic Ointment:  Wash the wound and your hands with soap and water after removal to prevent catching any tick disease.  Apply an over the counter antibiotic ointment (e.g. bacitracin) to the bite once. Expected Course: Tick bites normally don't itch or hurt. That's why they often go unnoticed. Call Your Doctor If:  You can't remove the tick or the tick's head Fever, a severe head ache, or rash occur in the next 2 weeks Bite begins to look infected Lyme's disease is common in your area You have not had a tetanus in the last 10 years Your  current symptoms become worse    MAKE SURE YOU  Understand these instructions. Will watch your condition. Will get help right away if you are not doing well or get worse.    Thank you for choosing an e-visit.  Your e-visit answers were reviewed by a board certified advanced clinical practitioner to complete your personal care plan. Depending upon the condition, your plan could have included both over the counter or prescription medications.  Please review your pharmacy choice. Make sure the pharmacy is open so you can pick up prescription now. If there is a problem, you may contact your provider through [employer] and have the prescription routed to another pharmacy.  Your safety is important to [place]. If you have drug allergies check your prescription carefully.   For the next 24 hours you can use MyChart to ask questions about today's visit, request a non-urgent call back, or ask for a work or school excuse. You will get an email in the next two days asking about your experience. I hope that your e-visit has been valuable and will speed your recovery.   I have spent 5 minutes in review of e-visit questionnaire, review and updating patient chart, medical decision making and response to patient.   Tylene Fantasia Ward, PA-C

## 2023-04-08 ENCOUNTER — Ambulatory Visit (INDEPENDENT_AMBULATORY_CARE_PROVIDER_SITE_OTHER): Payer: BC Managed Care – PPO | Admitting: Internal Medicine

## 2023-04-08 ENCOUNTER — Encounter: Payer: Self-pay | Admitting: Internal Medicine

## 2023-04-08 VITALS — BP 131/74 | HR 77 | Ht 71.0 in | Wt 202.2 lb

## 2023-04-08 DIAGNOSIS — S20462A Insect bite (nonvenomous) of left back wall of thorax, initial encounter: Secondary | ICD-10-CM

## 2023-04-08 DIAGNOSIS — J019 Acute sinusitis, unspecified: Secondary | ICD-10-CM | POA: Diagnosis not present

## 2023-04-08 DIAGNOSIS — W57XXXA Bitten or stung by nonvenomous insect and other nonvenomous arthropods, initial encounter: Secondary | ICD-10-CM | POA: Diagnosis not present

## 2023-04-08 MED ORDER — AZITHROMYCIN 250 MG PO TABS: ORAL_TABLET | ORAL | 0 refills | Status: AC

## 2023-04-08 NOTE — Patient Instructions (Signed)
Please start taking Azithromycin as prescribed.  Please use Flonase for allergies and nasal congestion. If you have persistent symptoms, please take Zyrtec or Xyzal once daily.  Please use humidifier at nighttime and okay to use vaporizer for steam inhalation for sinus congestion.

## 2023-04-08 NOTE — Progress Notes (Signed)
Acute Office Visit  Subjective:    Patient ID: Stephen [name], male    DOB: 09-Apr-1961, 62 y.o.   MRN: [id number]  Chief Complaint  Patient presents with   Sinusitis    Patient had a sinus infection a few weeks go, since then his sinus has been draining really bad, congested.  He also has been removing ticks off of himself for two weeks and has a spot on the back side of his left arm he would like to be looked at.     HPI Patient is in today for complaint of nasal congestion, postnasal drip and sinus pressure related facial pain for the last 2 weeks.  He was given Augmentin about 10 days ago, which had improved his symptoms, but have recurred since completing it.  He denies any fever, chills, dyspnea or wheezing currently.  He has tried using Flonase with some relief as well.  He denies any known allergy to dust or pollen.  He also reports tick bite on 05/21.  He has a bump over posterior side of the left axilla from the tick bite, but denies any rash or itching currently.  Denies any fever or chills.  Past Medical History:  Diagnosis Date   Depression    Hypertension     Past Surgical History:  Procedure Laterality Date   KNEE SURGERY      Family History  Problem Relation Age of Onset   Healthy Mother    Healthy Father     Social History   Socioeconomic History   Marital status: Legally Separated    Spouse name: Not on file   Number of children: Not on file   Years of education: Not on file   Highest education level: Bachelor's degree (e.g., BA, AB, BS)  Occupational History   Not on file  Tobacco Use   Smoking status: Never   Smokeless tobacco: Never  Substance and Sexual Activity   Alcohol use: Yes   Drug use: Never   Sexual activity: Not on file  Other Topics Concern   Not on file  Social History Narrative   Not on file   Social Determinants of Health   Financial Resource Strain: Low Risk  (04/08/2023)   Overall Financial Resource Strain (CARDIA)     Difficulty of Paying Living Expenses: Not hard at all  Food Insecurity: No Food Insecurity (04/08/2023)   Hunger Vital Sign    Worried About Running Out of Food in the Last Year: Never true    Ran Out of Food in the Last Year: Never true  Transportation Needs: No Transportation Needs (04/08/2023)   PRAPARE - Administrator, Civil Service (Medical): No    Lack of Transportation (Non-Medical): No  Physical Activity: Insufficiently Active (04/08/2023)   Exercise Vital Sign    Days of Exercise per Week: 4 days    Minutes of Exercise per Session: 20 min  Stress: Stress Concern Present (04/08/2023)   [employer] of Occupational Health - Occupational Stress Questionnaire    Feeling of Stress : To some extent  Social Connections: Unknown (04/08/2023)   Social Connection and Isolation Panel [NHANES]    Frequency of Communication with Friends and Family: More than three times a week    Frequency of Social Gatherings with Friends and Family: Twice a week    Attends Religious Services: 1 to 4 times per year    Active Member of [employer] or Organizations: Patient declined    Attends [employer]  or Organization Meetings: Not on file    Marital Status: Separated  Intimate Partner Violence: Not on file    Outpatient Medications Prior to Visit  Medication Sig Dispense Refill   buPROPion (WELLBUTRIN XL) 300 MG 24 hr tablet TAKE (1) TABLET BY MOUTH ONCE DAILY. 90 tablet 3   fluticasone (FLONASE) 50 MCG/ACT nasal spray Place 2 sprays into both nostrils daily. 16 g 6   tadalafil (CIALIS) 10 MG tablet Take 1 tablet (10 mg total) by mouth every other day as needed for erectile dysfunction. 10 tablet 0   No facility-administered medications prior to visit.    No Known Allergies  Review of Systems  Constitutional:  Negative for chills and fever.  HENT:  Positive for congestion, postnasal drip and sinus pressure. Negative for sore throat.   Eyes:  Negative for pain and discharge.  Respiratory:   Negative for cough and shortness of breath.   Cardiovascular:  Negative for chest pain and palpitations.  Gastrointestinal:  Negative for diarrhea, nausea and vomiting.  Endocrine: Negative for polydipsia and polyuria.  Genitourinary:  Negative for dysuria and hematuria.  Musculoskeletal:  Negative for neck pain and neck stiffness.  Skin:  Negative for rash.  Neurological:  Negative for dizziness, weakness, numbness and headaches.  Psychiatric/Behavioral:  Negative for agitation and behavioral problems.        Objective:    Physical Exam Vitals reviewed.  Constitutional:      General: He is not in acute distress.    Appearance: He is not diaphoretic.  HENT:     Head: Normocephalic and atraumatic.     Nose: Congestion present.     Right Sinus: Maxillary sinus tenderness present.     Left Sinus: Maxillary sinus tenderness present.     Mouth/Throat:     Mouth: Mucous membranes are moist.  Eyes:     General: No scleral icterus.    Extraocular Movements: Extraocular movements intact.  Cardiovascular:     Rate and Rhythm: Normal rate and regular rhythm.     Heart sounds: Normal heart sounds. No murmur heard. Pulmonary:     Breath sounds: Normal breath sounds. No wheezing or rales.  Musculoskeletal:     Cervical back: Neck supple. No tenderness.     Right lower leg: No edema.     Left lower leg: No edema.  Skin:    General: Skin is warm.     Findings: No rash.     Comments: Erythematous papules over posterior border of left axilla from tick bite  Neurological:     General: No focal deficit present.     Mental Status: He is alert and oriented to person, place, and time.     Sensory: No sensory deficit.     Motor: No weakness.  Psychiatric:        Mood and Affect: Mood normal.        Behavior: Behavior normal.     BP 131/74 (BP Location: Left Arm, Patient Position: Sitting, Cuff Size: Normal)   Pulse 77   Ht 5\' 11"  (1.803 m)   Wt 202 lb 3.2 oz (91.7 kg)   SpO2 97%    BMI 28.20 kg/m  Wt Readings from Last 3 Encounters:  04/08/23 202 lb 3.2 oz (91.7 kg)  01/17/23 202 lb 9.6 oz (91.9 kg)  07/26/22 205 lb 3.2 oz (93.1 kg)        Assessment & Plan:   Problem List Items Addressed This Visit  Respiratory   Acute non-recurrent sinusitis - Primary    Likely has an allergic sinusitis, complicated by bacterial sinusitis now Recently completed Augmentin, symptoms recurred Started empiric azithromycin Continue Flonase for allergies Advised to take Zyrtec or Xyzal for allergies Advised to use humidifier and/or vaporizer for nasal congestion      Relevant Medications   azithromycin (ZITHROMAX) 250 MG tablet     Musculoskeletal and Integument   Tick bite of left back wall of thorax    No concerning rash currently Would avoid oral doxycycline Keep area clean and dry        Meds ordered this encounter  Medications   azithromycin (ZITHROMAX) 250 MG tablet    Sig: Take 2 tablets on day 1, then 1 tablet daily on days 2 through 5    Dispense:  6 tablet    Refill:  0     Mikolaj Woolstenhulme Concha Se, MD

## 2023-04-08 NOTE — Assessment & Plan Note (Signed)
No concerning rash currently Would avoid oral doxycycline Keep area clean and dry

## 2023-04-08 NOTE — Assessment & Plan Note (Signed)
Likely has an allergic sinusitis, complicated by bacterial sinusitis now Recently completed Augmentin, symptoms recurred Started empiric azithromycin Continue Flonase for allergies Advised to take Zyrtec or Xyzal for allergies Advised to use humidifier and/or vaporizer for nasal congestion

## 2023-04-28 ENCOUNTER — Other Ambulatory Visit: Payer: Self-pay | Admitting: Internal Medicine

## 2023-04-28 DIAGNOSIS — N529 Male erectile dysfunction, unspecified: Secondary | ICD-10-CM

## 2023-05-04 DIAGNOSIS — G4733 Obstructive sleep apnea (adult) (pediatric): Secondary | ICD-10-CM | POA: Diagnosis not present

## 2023-05-05 DIAGNOSIS — G4733 Obstructive sleep apnea (adult) (pediatric): Secondary | ICD-10-CM | POA: Diagnosis not present

## 2023-05-13 ENCOUNTER — Other Ambulatory Visit: Payer: Self-pay | Admitting: Internal Medicine

## 2023-05-13 DIAGNOSIS — N529 Male erectile dysfunction, unspecified: Secondary | ICD-10-CM

## 2023-05-31 ENCOUNTER — Encounter: Payer: Self-pay | Admitting: Internal Medicine

## 2023-06-03 ENCOUNTER — Other Ambulatory Visit: Payer: Self-pay | Admitting: Internal Medicine

## 2023-06-03 DIAGNOSIS — G4733 Obstructive sleep apnea (adult) (pediatric): Secondary | ICD-10-CM | POA: Diagnosis not present

## 2023-06-03 DIAGNOSIS — N529 Male erectile dysfunction, unspecified: Secondary | ICD-10-CM

## 2023-06-05 DIAGNOSIS — G4733 Obstructive sleep apnea (adult) (pediatric): Secondary | ICD-10-CM | POA: Diagnosis not present

## 2023-06-12 ENCOUNTER — Ambulatory Visit (INDEPENDENT_AMBULATORY_CARE_PROVIDER_SITE_OTHER): Payer: BC Managed Care – PPO | Admitting: Internal Medicine

## 2023-06-12 ENCOUNTER — Encounter: Payer: Self-pay | Admitting: Internal Medicine

## 2023-06-12 VITALS — BP 134/75 | HR 79 | Ht 71.0 in | Wt 204.6 lb

## 2023-06-12 DIAGNOSIS — M722 Plantar fascial fibromatosis: Secondary | ICD-10-CM

## 2023-06-12 DIAGNOSIS — M10072 Idiopathic gout, left ankle and foot: Secondary | ICD-10-CM | POA: Diagnosis not present

## 2023-06-12 NOTE — Assessment & Plan Note (Signed)
Left heel pain likely due to plantar fasciitis Ibuprofen as needed for pain-  Prefers to take it OTC Rest, ice and leg elevation advised Can use plantar fascitis brace

## 2023-06-12 NOTE — Assessment & Plan Note (Signed)
Unclear if ankle swelling was due to gout flareup Since swelling has resolved now, check uric acid level and BMP

## 2023-06-12 NOTE — Patient Instructions (Addendum)
Please apply ice and perform leg elevation for foot pain.  Please apply plantar fasciitis brace for foot pain.

## 2023-06-12 NOTE — Progress Notes (Signed)
Acute Office Visit  Subjective:    Patient ID: Stephen [name], male    DOB: Nov 29, 1960, 62 y.o.   MRN: [id number]  Chief Complaint  Patient presents with   Gout    Patient thinks he had a gout flare up last week     HPI Patient is in today for evaluation of left ankle swelling and pain in the last week.  He had a constant, dull pain, which improved with ibuprofen.  He also tried ice and leg elevation.  Today, his ankle swelling and pain has resolved, but he is current concerned about gout.  He reports he had pork liver and 3 beers in the last week, which could have provoked a gout flareup.  Denies any known history of gout.  He also reports pain in the left heel area, which is dull, intermittent and radiating towards the toes.  Denies any numbness or tingling of the LE.    Past Medical History:  Diagnosis Date   Depression    Hypertension     Past Surgical History:  Procedure Laterality Date   KNEE SURGERY      Family History  Problem Relation Age of Onset   Healthy Mother    Healthy Father     Social History   Socioeconomic History   Marital status: Legally Separated    Spouse name: Not on file   Number of children: Not on file   Years of education: Not on file   Highest education level: Bachelor's degree (e.g., BA, AB, BS)  Occupational History   Not on file  Tobacco Use   Smoking status: Never   Smokeless tobacco: Never  Substance and Sexual Activity   Alcohol use: Yes   Drug use: Never   Sexual activity: Not on file  Other Topics Concern   Not on file  Social History Narrative   Not on file   Social Determinants of Health   Financial Resource Strain: Low Risk  (04/08/2023)   Overall Financial Resource Strain (CARDIA)    Difficulty of Paying Living Expenses: Not hard at all  Food Insecurity: No Food Insecurity (04/08/2023)   Hunger Vital Sign    Worried About Running Out of Food in the Last Year: Never true    Ran Out of Food in the Last Year:  Never true  Transportation Needs: No Transportation Needs (04/08/2023)   PRAPARE - Administrator, Civil Service (Medical): No    Lack of Transportation (Non-Medical): No  Physical Activity: Insufficiently Active (04/08/2023)   Exercise Vital Sign    Days of Exercise per Week: 4 days    Minutes of Exercise per Session: 20 min  Stress: Stress Concern Present (04/08/2023)   [employer] of Occupational Health - Occupational Stress Questionnaire    Feeling of Stress : To some extent  Social Connections: Unknown (04/08/2023)   Social Connection and Isolation Panel [NHANES]    Frequency of Communication with Friends and Family: More than three times a week    Frequency of Social Gatherings with Friends and Family: Twice a week    Attends Religious Services: 1 to 4 times per year    Active Member of [employer] or Organizations: Patient declined    Attends Banker Meetings: Not on file    Marital Status: Separated  Intimate Partner Violence: Not on file    Outpatient Medications Prior to Visit  Medication Sig Dispense Refill   buPROPion (WELLBUTRIN XL) 300 MG 24 hr tablet  TAKE (1) TABLET BY MOUTH ONCE DAILY. 90 tablet 3   fluticasone (FLONASE) 50 MCG/ACT nasal spray Place 2 sprays into both nostrils daily. 16 g 6   tadalafil (CIALIS) 10 MG tablet TTAKE 1 TABLET BY MOUTH EVERY OTHER DAY AS NEEDED FOR ERECTILE DYSFUNCTIONS 10 tablet 0   No facility-administered medications prior to visit.    No Known Allergies  Review of Systems  Constitutional:  Negative for chills and fever.  HENT:  Negative for congestion and sore throat.   Eyes:  Negative for pain and discharge.  Respiratory:  Negative for cough and shortness of breath.   Cardiovascular:  Negative for chest pain and palpitations.  Gastrointestinal:  Negative for diarrhea, nausea and vomiting.  Endocrine: Negative for polydipsia and polyuria.  Genitourinary:  Negative for dysuria and hematuria.  Musculoskeletal:   Negative for neck pain and neck stiffness.       Left ankle and sole pain  Skin:  Negative for rash.  Neurological:  Negative for dizziness, weakness, numbness and headaches.  Psychiatric/Behavioral:  Negative for agitation and behavioral problems.        Objective:    Physical Exam Vitals reviewed.  Constitutional:      General: He is not in acute distress.    Appearance: He is not diaphoretic.  HENT:     Head: Normocephalic and atraumatic.     Mouth/Throat:     Mouth: Mucous membranes are moist.  Eyes:     General: No scleral icterus.    Extraocular Movements: Extraocular movements intact.  Cardiovascular:     Rate and Rhythm: Regular rhythm.     Heart sounds: Normal heart sounds. No murmur heard. Pulmonary:     Breath sounds: Normal breath sounds. No wheezing or rales.  Musculoskeletal:     Cervical back: Neck supple. No tenderness.     Left knee: No swelling. Normal range of motion.     Right lower leg: No edema.     Left lower leg: No edema.     Comments: Slight tenderness in the left heel area  Skin:    General: Skin is warm.     Findings: No rash.  Neurological:     General: No focal deficit present.     Mental Status: He is alert and oriented to person, place, and time.  Psychiatric:        Mood and Affect: Mood normal.        Behavior: Behavior normal.     BP 134/75 (BP Location: Left Arm, Patient Position: Sitting, Cuff Size: Normal)   Pulse 79   Ht 5\' 11"  (1.803 m)   Wt 204 lb 9.6 oz (92.8 kg)   SpO2 96%   BMI 28.54 kg/m  Wt Readings from Last 3 Encounters:  06/12/23 204 lb 9.6 oz (92.8 kg)  04/08/23 202 lb 3.2 oz (91.7 kg)  01/17/23 202 lb 9.6 oz (91.9 kg)        Assessment & Plan:   Problem List Items Addressed This Visit       Musculoskeletal and Integument   Acute idiopathic gout of left ankle - Primary    Unclear if ankle swelling was due to gout flareup Since swelling has resolved now, check uric acid level and BMP      Relevant  Orders   Uric acid   Basic Metabolic Panel (BMET)   Plantar fasciitis of left foot    Left heel pain likely due to plantar fasciitis Ibuprofen as needed for pain-  Prefers to take it OTC Rest, ice and leg elevation advised Can use plantar fascitis brace        No orders of the defined types were placed in this encounter.    Anabel Halon, MD

## 2023-06-17 DIAGNOSIS — G4733 Obstructive sleep apnea (adult) (pediatric): Secondary | ICD-10-CM | POA: Diagnosis not present

## 2023-06-18 ENCOUNTER — Other Ambulatory Visit: Payer: Self-pay | Admitting: Internal Medicine

## 2023-06-18 DIAGNOSIS — N529 Male erectile dysfunction, unspecified: Secondary | ICD-10-CM

## 2023-07-04 DIAGNOSIS — G4733 Obstructive sleep apnea (adult) (pediatric): Secondary | ICD-10-CM | POA: Diagnosis not present

## 2023-07-06 DIAGNOSIS — G4733 Obstructive sleep apnea (adult) (pediatric): Secondary | ICD-10-CM | POA: Diagnosis not present

## 2023-07-07 ENCOUNTER — Other Ambulatory Visit: Payer: Self-pay | Admitting: Internal Medicine

## 2023-07-07 DIAGNOSIS — N529 Male erectile dysfunction, unspecified: Secondary | ICD-10-CM

## 2023-07-22 ENCOUNTER — Other Ambulatory Visit: Payer: Self-pay | Admitting: Internal Medicine

## 2023-07-22 DIAGNOSIS — N529 Male erectile dysfunction, unspecified: Secondary | ICD-10-CM

## 2023-07-25 ENCOUNTER — Ambulatory Visit (INDEPENDENT_AMBULATORY_CARE_PROVIDER_SITE_OTHER): Payer: BC Managed Care – PPO | Admitting: Internal Medicine

## 2023-07-25 ENCOUNTER — Encounter: Payer: Self-pay | Admitting: Internal Medicine

## 2023-07-25 VITALS — BP 125/73 | HR 69 | Ht 70.0 in | Wt 205.4 lb

## 2023-07-25 DIAGNOSIS — E782 Mixed hyperlipidemia: Secondary | ICD-10-CM

## 2023-07-25 DIAGNOSIS — Z125 Encounter for screening for malignant neoplasm of prostate: Secondary | ICD-10-CM

## 2023-07-25 DIAGNOSIS — F411 Generalized anxiety disorder: Secondary | ICD-10-CM

## 2023-07-25 DIAGNOSIS — R739 Hyperglycemia, unspecified: Secondary | ICD-10-CM | POA: Diagnosis not present

## 2023-07-25 DIAGNOSIS — R7989 Other specified abnormal findings of blood chemistry: Secondary | ICD-10-CM | POA: Insufficient documentation

## 2023-07-25 DIAGNOSIS — E559 Vitamin D deficiency, unspecified: Secondary | ICD-10-CM

## 2023-07-25 DIAGNOSIS — G4733 Obstructive sleep apnea (adult) (pediatric): Secondary | ICD-10-CM | POA: Diagnosis not present

## 2023-07-25 NOTE — Assessment & Plan Note (Signed)
Ordered PSA after discussing its limitations for prostate cancer screening, including false positive results leading to additional investigations.

## 2023-07-25 NOTE — Assessment & Plan Note (Signed)
Chronically elevated serum creatinine He exercises regularly, could be due to higher muscle mass Last GFR was 55 Check Cystatin C with CMP Maintain adequate hydration

## 2023-07-25 NOTE — Assessment & Plan Note (Signed)
Well-controlled with Wellbutrin

## 2023-07-25 NOTE — Patient Instructions (Signed)
Please continue to take medications as prescribed.  Please continue to follow heart healthy diet and perform moderate exercise/walking at least 150 mins/week.

## 2023-07-25 NOTE — Progress Notes (Signed)
Established Patient Office Visit  Subjective:  Patient ID: Stephen [name], male    DOB: 07/17/1961  Age: 62 y.o. MRN: [id number]  CC:  Chief Complaint  Patient presents with   Depression    Six month follow up     HPI [name] is a 62 y.o. male with past medical history of depression, anxiety and erectile dysfunction who presents for f/u of her chronic medical conditions.  He takes Wellbutrin for depression and anxiety.  He has tried Zoloft and Lexapro in the past.  He received BH therapy in the past as well.  Denies any anhedonia, SI or HI currently.  He takes Cialis as needed for ED.  Denies any dysuria or hematuria currently.  His foot pain has improved with ice, leg elevation and brace. His uric acid level was normal. His S. Creatine has been borderline elevated for many years. He exercises regularly, lifts weights.    Past Medical History:  Diagnosis Date   Depression    Hypertension     Past Surgical History:  Procedure Laterality Date   KNEE SURGERY      Family History  Problem Relation Age of Onset   Healthy Mother    Healthy Father     Social History   Socioeconomic History   Marital status: Legally Separated    Spouse name: Not on file   Number of children: Not on file   Years of education: Not on file   Highest education level: Bachelor's degree (e.g., BA, AB, BS)  Occupational History   Not on file  Tobacco Use   Smoking status: Never   Smokeless tobacco: Never  Substance and Sexual Activity   Alcohol use: Yes   Drug use: Never   Sexual activity: Not on file  Other Topics Concern   Not on file  Social History Narrative   Not on file   Social Determinants of Health   Financial Resource Strain: Low Risk  (04/08/2023)   Overall Financial Resource Strain (CARDIA)    Difficulty of Paying Living Expenses: Not hard at all  Food Insecurity: No Food Insecurity (04/08/2023)   Hunger Vital Sign    Worried About Running Out of Food in  the Last Year: Never true    Ran Out of Food in the Last Year: Never true  Transportation Needs: No Transportation Needs (04/08/2023)   PRAPARE - Administrator, Civil Service (Medical): No    Lack of Transportation (Non-Medical): No  Physical Activity: Insufficiently Active (04/08/2023)   Exercise Vital Sign    Days of Exercise per Week: 4 days    Minutes of Exercise per Session: 20 min  Stress: Stress Concern Present (04/08/2023)   [employer] of Occupational Health - Occupational Stress Questionnaire    Feeling of Stress : To some extent  Social Connections: Unknown (04/08/2023)   Social Connection and Isolation Panel [NHANES]    Frequency of Communication with Friends and Family: More than three times a week    Frequency of Social Gatherings with Friends and Family: Twice a week    Attends Religious Services: 1 to 4 times per year    Active Member of [employer] or Organizations: Patient declined    Attends Engineer, structural: Not on file    Marital Status: Separated  Intimate Partner Violence: Not on file    Outpatient Medications Prior to Visit  Medication Sig Dispense Refill   buPROPion (WELLBUTRIN XL) 300 MG 24 hr tablet TAKE (  1) TABLET BY MOUTH ONCE DAILY. 90 tablet 3   fluticasone (FLONASE) 50 MCG/ACT nasal spray Place 2 sprays into both nostrils daily. 16 g 6   tadalafil (CIALIS) 10 MG tablet TAKE 1 TABLET BY MOUTH EVERY OTHER DAY AS NEEDED FOR ERECTILE DYSFUNCTION. 10 tablet 0   No facility-administered medications prior to visit.    No Known Allergies  ROS Review of Systems  Constitutional:  Negative for chills and fever.  HENT:  Negative for congestion and sore throat.   Eyes:  Negative for pain and discharge.  Respiratory:  Negative for cough and shortness of breath.   Cardiovascular:  Negative for chest pain and palpitations.  Gastrointestinal:  Negative for diarrhea, nausea and vomiting.  Endocrine: Negative for polydipsia and polyuria.   Genitourinary:  Negative for dysuria and hematuria.  Musculoskeletal:  Negative for neck pain and neck stiffness.  Skin:  Negative for rash.  Neurological:  Negative for dizziness, weakness, numbness and headaches.  Psychiatric/Behavioral:  Negative for agitation and behavioral problems.       Objective:    Physical Exam Vitals reviewed.  Constitutional:      General: He is not in acute distress.    Appearance: He is not diaphoretic.  HENT:     Head: Normocephalic and atraumatic.     Mouth/Throat:     Mouth: Mucous membranes are moist.  Eyes:     General: No scleral icterus.    Extraocular Movements: Extraocular movements intact.  Cardiovascular:     Rate and Rhythm: Regular rhythm.     Heart sounds: Normal heart sounds. No murmur heard. Pulmonary:     Breath sounds: Normal breath sounds. No wheezing or rales.  Musculoskeletal:     Cervical back: Neck supple. No tenderness.     Left knee: No swelling. Normal range of motion.     Right lower leg: No edema.     Left lower leg: No edema.  Skin:    General: Skin is warm.     Findings: No rash.  Neurological:     General: No focal deficit present.     Mental Status: He is alert and oriented to person, place, and time.  Psychiatric:        Mood and Affect: Mood normal.        Behavior: Behavior normal.     BP 125/73 (BP Location: Left Arm, Patient Position: Sitting, Cuff Size: Normal)   Pulse 69   Ht 5\' 10"  (1.778 m)   Wt 205 lb 6.4 oz (93.2 kg)   SpO2 97%   BMI 29.47 kg/m  Wt Readings from Last 3 Encounters:  07/25/23 205 lb 6.4 oz (93.2 kg)  06/12/23 204 lb 9.6 oz (92.8 kg)  04/08/23 202 lb 3.2 oz (91.7 kg)    Lab Results  Component Value Date   TSH 1.410 01/17/2023   Lab Results  Component Value Date   WBC 6.7 01/17/2023   HGB 14.7 01/17/2023   HCT 43.4 01/17/2023   MCV 93 01/17/2023   PLT 292 01/17/2023   Lab Results  Component Value Date   NA 138 06/12/2023   K 4.5 06/12/2023   CO2 20  06/12/2023   GLUCOSE 81 06/12/2023   BUN 12 06/12/2023   CREATININE 1.44 (H) 06/12/2023   BILITOT 0.5 01/17/2023   ALKPHOS 71 01/17/2023   AST 22 01/17/2023   ALT 24 01/17/2023   PROT 7.3 01/17/2023   ALBUMIN 4.8 01/17/2023   CALCIUM 9.6 06/12/2023   ANIONGAP  6 06/20/2021   EGFR 55 (L) 06/12/2023   Lab Results  Component Value Date   CHOL 160 01/17/2023   Lab Results  Component Value Date   HDL 54 01/17/2023   Lab Results  Component Value Date   LDLCALC 88 01/17/2023   Lab Results  Component Value Date   TRIG 100 01/17/2023   Lab Results  Component Value Date   CHOLHDL 3.0 01/17/2023   Lab Results  Component Value Date   HGBA1C 5.3 01/17/2023      Assessment & Plan:   Problem List Items Addressed This Visit       Respiratory   Obstructive sleep apnea syndrome    Uses CPAP regularly.      Relevant Orders   CMP14+EGFR   CBC with Differential/Platelet     Other   Generalized anxiety disorder    Well-controlled with Wellbutrin      Relevant Orders   TSH   CMP14+EGFR   CBC with Differential/Platelet   Prostate cancer screening    Ordered PSA after discussing its limitations for prostate cancer screening, including false positive results leading to additional investigations.      Relevant Orders   PSA   Elevated serum creatinine - Primary    Chronically elevated serum creatinine He exercises regularly, could be due to higher muscle mass Last GFR was 55 Check Cystatin C with CMP Maintain adequate hydration      Relevant Orders   CMP14+EGFR   Cystatin C   Other Visit Diagnoses     Vitamin D deficiency       Relevant Orders   VITAMIN D 25 Hydroxy (Vit-D Deficiency, Fractures)   Mixed hyperlipidemia       Relevant Orders   Lipid panel   Hyperglycemia       Relevant Orders   Hemoglobin A1c       No orders of the defined types were placed in this encounter.   Follow-up: Return in about 6 months (around 01/22/2024) for Annual  physical.    Anabel Halon, MD

## 2023-07-25 NOTE — Assessment & Plan Note (Signed)
Uses CPAP regularly

## 2023-08-03 DIAGNOSIS — G4733 Obstructive sleep apnea (adult) (pediatric): Secondary | ICD-10-CM | POA: Diagnosis not present

## 2023-08-04 DIAGNOSIS — G4733 Obstructive sleep apnea (adult) (pediatric): Secondary | ICD-10-CM | POA: Diagnosis not present

## 2023-08-13 ENCOUNTER — Other Ambulatory Visit: Payer: Self-pay | Admitting: Internal Medicine

## 2023-08-13 DIAGNOSIS — N529 Male erectile dysfunction, unspecified: Secondary | ICD-10-CM

## 2023-09-02 DIAGNOSIS — G4733 Obstructive sleep apnea (adult) (pediatric): Secondary | ICD-10-CM | POA: Diagnosis not present

## 2023-09-03 ENCOUNTER — Other Ambulatory Visit: Payer: Self-pay | Admitting: Internal Medicine

## 2023-09-03 DIAGNOSIS — G4733 Obstructive sleep apnea (adult) (pediatric): Secondary | ICD-10-CM | POA: Diagnosis not present

## 2023-09-03 DIAGNOSIS — N529 Male erectile dysfunction, unspecified: Secondary | ICD-10-CM

## 2023-09-25 DIAGNOSIS — L820 Inflamed seborrheic keratosis: Secondary | ICD-10-CM | POA: Diagnosis not present

## 2023-10-03 DIAGNOSIS — G4733 Obstructive sleep apnea (adult) (pediatric): Secondary | ICD-10-CM | POA: Diagnosis not present

## 2023-10-04 DIAGNOSIS — G4733 Obstructive sleep apnea (adult) (pediatric): Secondary | ICD-10-CM | POA: Diagnosis not present

## 2023-10-16 ENCOUNTER — Ambulatory Visit (INDEPENDENT_AMBULATORY_CARE_PROVIDER_SITE_OTHER): Payer: BC Managed Care – PPO

## 2023-10-16 DIAGNOSIS — Z23 Encounter for immunization: Secondary | ICD-10-CM

## 2023-11-02 DIAGNOSIS — G4733 Obstructive sleep apnea (adult) (pediatric): Secondary | ICD-10-CM | POA: Diagnosis not present

## 2023-11-03 DIAGNOSIS — G4733 Obstructive sleep apnea (adult) (pediatric): Secondary | ICD-10-CM | POA: Diagnosis not present

## 2023-11-28 DIAGNOSIS — R7989 Other specified abnormal findings of blood chemistry: Secondary | ICD-10-CM | POA: Diagnosis not present

## 2023-11-28 DIAGNOSIS — R739 Hyperglycemia, unspecified: Secondary | ICD-10-CM | POA: Diagnosis not present

## 2023-11-28 DIAGNOSIS — F411 Generalized anxiety disorder: Secondary | ICD-10-CM | POA: Diagnosis not present

## 2023-11-28 DIAGNOSIS — Z125 Encounter for screening for malignant neoplasm of prostate: Secondary | ICD-10-CM | POA: Diagnosis not present

## 2023-11-28 DIAGNOSIS — E782 Mixed hyperlipidemia: Secondary | ICD-10-CM | POA: Diagnosis not present

## 2023-11-29 LAB — CBC WITH DIFFERENTIAL/PLATELET
Basophils Absolute: 0 10*3/uL (ref 0.0–0.2)
Basos: 1 %
EOS (ABSOLUTE): 0.2 10*3/uL (ref 0.0–0.4)
Eos: 4 %
Hematocrit: 43.2 % (ref 37.5–51.0)
Hemoglobin: 14.2 g/dL (ref 13.0–17.7)
Immature Grans (Abs): 0 10*3/uL (ref 0.0–0.1)
Immature Granulocytes: 0 %
Lymphocytes Absolute: 1.4 10*3/uL (ref 0.7–3.1)
Lymphs: 23 %
MCH: 31.6 pg (ref 26.6–33.0)
MCHC: 32.9 g/dL (ref 31.5–35.7)
MCV: 96 fL (ref 79–97)
Monocytes Absolute: 0.4 10*3/uL (ref 0.1–0.9)
Monocytes: 7 %
Neutrophils Absolute: 3.9 10*3/uL (ref 1.4–7.0)
Neutrophils: 65 %
Platelets: 244 10*3/uL (ref 150–450)
RBC: 4.5 x10E6/uL (ref 4.14–5.80)
RDW: 12.5 % (ref 11.6–15.4)
WBC: 5.9 10*3/uL (ref 3.4–10.8)

## 2023-11-29 LAB — CMP14+EGFR
ALT: 29 [IU]/L (ref 0–44)
AST: 27 [IU]/L (ref 0–40)
Albumin: 4.1 g/dL (ref 3.9–4.9)
Alkaline Phosphatase: 65 [IU]/L (ref 44–121)
BUN/Creatinine Ratio: 10 (ref 10–24)
BUN: 14 mg/dL (ref 8–27)
Bilirubin Total: 0.4 mg/dL (ref 0.0–1.2)
CO2: 23 mmol/L (ref 20–29)
Calcium: 9 mg/dL (ref 8.6–10.2)
Chloride: 104 mmol/L (ref 96–106)
Creatinine, Ser: 1.36 mg/dL — ABNORMAL HIGH (ref 0.76–1.27)
Globulin, Total: 2.4 g/dL (ref 1.5–4.5)
Glucose: 71 mg/dL (ref 70–99)
Potassium: 4.4 mmol/L (ref 3.5–5.2)
Sodium: 141 mmol/L (ref 134–144)
Total Protein: 6.5 g/dL (ref 6.0–8.5)
eGFR: 59 mL/min/{1.73_m2} — ABNORMAL LOW (ref >59)

## 2023-11-29 LAB — LIPID PANEL
Chol/HDL Ratio: 3.6 {ratio} (ref 0.0–5.0)
Cholesterol, Total: 166 mg/dL (ref 100–199)
HDL: 46 mg/dL (ref >39)
LDL Chol Calc (NIH): 104 mg/dL — ABNORMAL HIGH (ref 0–99)
Triglycerides: 88 mg/dL (ref 0–149)
VLDL Cholesterol Cal: 16 mg/dL (ref 5–40)

## 2023-11-29 LAB — PSA: Prostate Specific Ag, Serum: 1.2 ng/mL (ref 0.0–4.0)

## 2023-11-29 LAB — HEMOGLOBIN A1C
Est. average glucose Bld gHb Est-mCnc: 103 mg/dL
Hgb A1c MFr Bld: 5.2 % (ref 4.8–5.6)

## 2023-11-29 LAB — TSH: TSH: 1.74 u[IU]/mL (ref 0.450–4.500)

## 2023-11-29 LAB — VITAMIN D 25 HYDROXY (VIT D DEFICIENCY, FRACTURES): Vit D, 25-Hydroxy: 54.6 ng/mL (ref 30.0–100.0)

## 2023-11-29 LAB — CYSTATIN C: CYSTATIN C: 1.2 mg/L — ABNORMAL HIGH (ref 0.72–1.16)

## 2023-12-01 ENCOUNTER — Encounter: Payer: Self-pay | Admitting: Internal Medicine

## 2023-12-03 DIAGNOSIS — G4733 Obstructive sleep apnea (adult) (pediatric): Secondary | ICD-10-CM | POA: Diagnosis not present

## 2023-12-04 DIAGNOSIS — G4733 Obstructive sleep apnea (adult) (pediatric): Secondary | ICD-10-CM | POA: Diagnosis not present

## 2023-12-16 ENCOUNTER — Other Ambulatory Visit: Payer: Self-pay | Admitting: Internal Medicine

## 2023-12-16 DIAGNOSIS — N529 Male erectile dysfunction, unspecified: Secondary | ICD-10-CM

## 2024-01-02 DIAGNOSIS — G4733 Obstructive sleep apnea (adult) (pediatric): Secondary | ICD-10-CM | POA: Diagnosis not present

## 2024-01-22 ENCOUNTER — Ambulatory Visit (INDEPENDENT_AMBULATORY_CARE_PROVIDER_SITE_OTHER): Payer: BC Managed Care – PPO | Admitting: Internal Medicine

## 2024-01-22 ENCOUNTER — Encounter: Payer: Self-pay | Admitting: Internal Medicine

## 2024-01-22 VITALS — BP 121/71 | HR 68 | Ht 70.0 in | Wt 210.0 lb

## 2024-01-22 DIAGNOSIS — R7989 Other specified abnormal findings of blood chemistry: Secondary | ICD-10-CM

## 2024-01-22 DIAGNOSIS — Z0001 Encounter for general adult medical examination with abnormal findings: Secondary | ICD-10-CM | POA: Diagnosis not present

## 2024-01-22 DIAGNOSIS — N529 Male erectile dysfunction, unspecified: Secondary | ICD-10-CM

## 2024-01-22 DIAGNOSIS — F411 Generalized anxiety disorder: Secondary | ICD-10-CM

## 2024-01-22 DIAGNOSIS — G4733 Obstructive sleep apnea (adult) (pediatric): Secondary | ICD-10-CM | POA: Diagnosis not present

## 2024-01-22 DIAGNOSIS — F339 Major depressive disorder, recurrent, unspecified: Secondary | ICD-10-CM

## 2024-01-22 MED ORDER — BUPROPION HCL ER (XL) 300 MG PO TB24
ORAL_TABLET | ORAL | 3 refills | Status: DC
Start: 2024-01-22 — End: 2024-07-29

## 2024-01-22 NOTE — Assessment & Plan Note (Signed)
 Well-controlled with Wellbutrin 300 mg QD

## 2024-01-22 NOTE — Assessment & Plan Note (Signed)
 Uses CPAP regularly

## 2024-01-22 NOTE — Assessment & Plan Note (Signed)
 Flowsheet Row Office Visit from 01/22/2024 in [facility]  PHQ-2 Total Score 1      Well-controlled with Wellbutrin Has tried Zoloft and Lexapro in the past

## 2024-01-22 NOTE — Assessment & Plan Note (Signed)
Physical exam as documented. Fasting blood tests reviewed today.

## 2024-01-22 NOTE — Assessment & Plan Note (Signed)
Takes Cialis PRN

## 2024-01-22 NOTE — Patient Instructions (Addendum)
 Please continue to take medications as prescribed.  Please continue to follow low salt diet and perform moderate exercise/walking at least 150 mins/week.  Please get fasting blood tests done before the next visit.

## 2024-01-22 NOTE — Assessment & Plan Note (Signed)
 Chronically elevated serum creatinine He exercises regularly, could be due to higher muscle mass Last GFR was 59 Checked Cystatin C with CMP Maintain adequate hydration Avoid nephrotoxic agents If decline in GFR less than 50, will discuss Nephrology evaluation

## 2024-01-22 NOTE — Progress Notes (Signed)
 Established Patient Office Visit  Subjective:  Patient ID: Stephen [name], male    DOB: 03-31-1961  Age: 63 y.o. MRN: [id number]  CC:  Chief Complaint  Patient presents with   Annual Exam    Annual exam.     HPI Stephen [name] is a 63 y.o. male with past medical history of depression, anxiety and erectile dysfunction who presents for annual physical.  He takes Wellbutrin for depression and anxiety.  He has tried Zoloft and Lexapro in the past.  He received BH therapy in the past as well.  Denies any anhedonia, SI or HI currently.  He takes Cialis as needed for ED.  Denies any dysuria or hematuria currently.  His S. Creatine has been borderline elevated for many years. He exercises regularly, lifts weights. His cystatin C level was also borderline elevated. GFR was 59, overall stable.  Denies dysuria, hematuria or urinary hesitancy or resistance.    Past Medical History:  Diagnosis Date   Depression    Hypertension     Past Surgical History:  Procedure Laterality Date   KNEE SURGERY      Family History  Problem Relation Age of Onset   Healthy Mother    Healthy Father     Social History   Socioeconomic History   Marital status: Legally Separated    Spouse name: Not on file   Number of children: Not on file   Years of education: Not on file   Highest education level: Bachelor's degree (e.g., BA, AB, BS)  Occupational History   Not on file  Tobacco Use   Smoking status: Never   Smokeless tobacco: Never  Substance and Sexual Activity   Alcohol use: Yes   Drug use: Never   Sexual activity: Not on file  Other Topics Concern   Not on file  Social History Narrative   Not on file   Social Drivers of Health   Financial Resource Strain: Low Risk  (01/18/2024)   Overall Financial Resource Strain (CARDIA)    Difficulty of Paying Living Expenses: Not hard at all  Food Insecurity: No Food Insecurity (01/18/2024)   Hunger Vital Sign    Worried About  Running Out of Food in the Last Year: Never true    Ran Out of Food in the Last Year: Never true  Transportation Needs: No Transportation Needs (01/18/2024)   PRAPARE - Administrator, Civil Service (Medical): No    Lack of Transportation (Non-Medical): No  Physical Activity: Insufficiently Active (01/18/2024)   Exercise Vital Sign    Days of Exercise per Week: 4 days    Minutes of Exercise per Session: 30 min  Stress: No Stress Concern Present (01/18/2024)   [employer] of Occupational Health - Occupational Stress Questionnaire    Feeling of Stress : Only a little  Social Connections: Moderately Isolated (01/18/2024)   Social Connection and Isolation Panel [NHANES]    Frequency of Communication with Friends and Family: Three times a week    Frequency of Social Gatherings with Friends and Family: Once a week    Attends Religious Services: Never    Database administrator or Organizations: No    Attends Engineer, structural: Not on file    Marital Status: Living with partner  Intimate Partner Violence: Not on file    Outpatient Medications Prior to Visit  Medication Sig Dispense Refill   fluticasone (FLONASE) 50 MCG/ACT nasal spray Place 2 sprays into both nostrils daily.  16 g 6   tadalafil (CIALIS) 10 MG tablet TAKE 1 TABLET BY MOUTH EVERY OTHER DAY AS NEEDED FOR ERECTILE DYSFUNCTION. 30 tablet 1   buPROPion (WELLBUTRIN XL) 300 MG 24 hr tablet TAKE (1) TABLET BY MOUTH ONCE DAILY. 90 tablet 3   No facility-administered medications prior to visit.    No Known Allergies  ROS Review of Systems  Constitutional:  Negative for chills and fever.  HENT:  Negative for congestion and sore throat.   Eyes:  Negative for pain and discharge.  Respiratory:  Negative for cough and shortness of breath.   Cardiovascular:  Negative for chest pain and palpitations.  Gastrointestinal:  Negative for diarrhea, nausea and vomiting.  Endocrine: Negative for polydipsia and  polyuria.  Genitourinary:  Negative for dysuria and hematuria.  Musculoskeletal:  Negative for neck pain and neck stiffness.  Skin:  Negative for rash.  Neurological:  Negative for dizziness, weakness, numbness and headaches.  Psychiatric/Behavioral:  Negative for agitation and behavioral problems.       Objective:    Physical Exam Vitals reviewed.  Constitutional:      General: He is not in acute distress.    Appearance: He is not diaphoretic.  HENT:     Head: Normocephalic and atraumatic.     Mouth/Throat:     Mouth: Mucous membranes are moist.  Eyes:     General: No scleral icterus.    Extraocular Movements: Extraocular movements intact.  Cardiovascular:     Rate and Rhythm: Regular rhythm.     Heart sounds: Normal heart sounds. No murmur heard. Pulmonary:     Breath sounds: Normal breath sounds. No wheezing or rales.  Musculoskeletal:     Cervical back: Neck supple. No tenderness.     Left knee: No swelling. Normal range of motion.     Right lower leg: No edema.     Left lower leg: No edema.  Skin:    General: Skin is warm.     Findings: No rash.  Neurological:     General: No focal deficit present.     Mental Status: He is alert and oriented to person, place, and time.  Psychiatric:        Mood and Affect: Mood normal.        Behavior: Behavior normal.     BP 121/71   Pulse 68   Ht 5\' 10"  (1.778 m)   Wt 210 lb (95.3 kg)   SpO2 97%   BMI 30.13 kg/m  Wt Readings from Last 3 Encounters:  01/22/24 210 lb (95.3 kg)  07/25/23 205 lb 6.4 oz (93.2 kg)  06/12/23 204 lb 9.6 oz (92.8 kg)    Lab Results  Component Value Date   TSH 1.740 11/28/2023   Lab Results  Component Value Date   WBC 5.9 11/28/2023   HGB 14.2 11/28/2023   HCT 43.2 11/28/2023   MCV 96 11/28/2023   PLT 244 11/28/2023   Lab Results  Component Value Date   NA 141 11/28/2023   K 4.4 11/28/2023   CO2 23 11/28/2023   GLUCOSE 71 11/28/2023   BUN 14 11/28/2023   CREATININE 1.36 (H)  11/28/2023   BILITOT 0.4 11/28/2023   ALKPHOS 65 11/28/2023   AST 27 11/28/2023   ALT 29 11/28/2023   PROT 6.5 11/28/2023   ALBUMIN 4.1 11/28/2023   CALCIUM 9.0 11/28/2023   ANIONGAP 6 06/20/2021   EGFR 59 (L) 11/28/2023   Lab Results  Component Value Date  CHOL 166 11/28/2023   Lab Results  Component Value Date   HDL 46 11/28/2023   Lab Results  Component Value Date   LDLCALC 104 (H) 11/28/2023   Lab Results  Component Value Date   TRIG 88 11/28/2023   Lab Results  Component Value Date   CHOLHDL 3.6 11/28/2023   Lab Results  Component Value Date   HGBA1C 5.2 11/28/2023      Assessment & Plan:   Problem List Items Addressed This Visit       Respiratory   Obstructive sleep apnea syndrome   Uses CPAP regularly.        Other   Generalized anxiety disorder (Chronic)   Well-controlled with Wellbutrin 300 mg QD      Relevant Medications   buPROPion (WELLBUTRIN XL) 300 MG 24 hr tablet   Depression, recurrent (HCC)   Flowsheet Row Office Visit from 01/22/2024 in [facility]  PHQ-2 Total Score 1      Well-controlled with Wellbutrin Has tried Zoloft and Lexapro in the past      Relevant Medications   buPROPion (WELLBUTRIN XL) 300 MG 24 hr tablet   Erectile dysfunction   Takes Cialis PRN      Encounter for general adult medical examination with abnormal findings - Primary   Physical exam as documented. Fasting blood tests reviewed today.      Elevated serum creatinine   Chronically elevated serum creatinine He exercises regularly, could be due to higher muscle mass Last GFR was 59 Checked Cystatin C with CMP Maintain adequate hydration Avoid nephrotoxic agents If decline in GFR less than 50, will discuss Nephrology evaluation      Relevant Orders   Basic Metabolic Panel (BMET)   CBC   Urine Microalbumin w/creat. ratio    Meds ordered this encounter  Medications   buPROPion (WELLBUTRIN XL) 300 MG 24 hr tablet     Sig: TAKE (1) TABLET BY MOUTH ONCE DAILY.    Dispense:  90 tablet    Refill:  3    Follow-up: Return in about 6 months (around 07/24/2024) for GAD.    Anabel Halon, MD

## 2024-01-26 ENCOUNTER — Encounter: Payer: Self-pay | Admitting: Internal Medicine

## 2024-01-26 DIAGNOSIS — R7989 Other specified abnormal findings of blood chemistry: Secondary | ICD-10-CM | POA: Diagnosis not present

## 2024-01-27 ENCOUNTER — Encounter: Payer: Self-pay | Admitting: Internal Medicine

## 2024-01-27 ENCOUNTER — Other Ambulatory Visit: Payer: Self-pay | Admitting: Internal Medicine

## 2024-01-27 DIAGNOSIS — R7989 Other specified abnormal findings of blood chemistry: Secondary | ICD-10-CM

## 2024-01-28 LAB — BASIC METABOLIC PANEL
BUN/Creatinine Ratio: 10 (ref 10–24)
BUN: 15 mg/dL (ref 8–27)
CO2: 22 mmol/L (ref 20–29)
Calcium: 9.8 mg/dL (ref 8.6–10.2)
Chloride: 105 mmol/L (ref 96–106)
Creatinine, Ser: 1.51 mg/dL — ABNORMAL HIGH (ref 0.76–1.27)
Glucose: 92 mg/dL (ref 70–99)
Potassium: 4.5 mmol/L (ref 3.5–5.2)
Sodium: 143 mmol/L (ref 134–144)
eGFR: 52 mL/min/{1.73_m2} — ABNORMAL LOW (ref >59)

## 2024-01-28 LAB — CBC
Hematocrit: 45 % (ref 37.5–51.0)
Hemoglobin: 15 g/dL (ref 13.0–17.7)
MCH: 31.4 pg (ref 26.6–33.0)
MCHC: 33.3 g/dL (ref 31.5–35.7)
MCV: 94 fL (ref 79–97)
Platelets: 262 10*3/uL (ref 150–450)
RBC: 4.77 x10E6/uL (ref 4.14–5.80)
RDW: 12.4 % (ref 11.6–15.4)
WBC: 5.7 10*3/uL (ref 3.4–10.8)

## 2024-01-28 LAB — MICROALBUMIN / CREATININE URINE RATIO
Creatinine, Urine: 161.8 mg/dL
Microalb/Creat Ratio: 2 mg/g{creat} (ref 0–29)
Microalbumin, Urine: 3.3 ug/mL

## 2024-02-24 ENCOUNTER — Encounter: Payer: Self-pay | Admitting: Internal Medicine

## 2024-02-24 ENCOUNTER — Other Ambulatory Visit: Payer: Self-pay | Admitting: Internal Medicine

## 2024-02-24 DIAGNOSIS — F411 Generalized anxiety disorder: Secondary | ICD-10-CM

## 2024-02-24 DIAGNOSIS — F339 Major depressive disorder, recurrent, unspecified: Secondary | ICD-10-CM

## 2024-03-08 ENCOUNTER — Other Ambulatory Visit: Payer: Self-pay | Admitting: Internal Medicine

## 2024-03-08 DIAGNOSIS — N1831 Chronic kidney disease, stage 3a: Secondary | ICD-10-CM | POA: Insufficient documentation

## 2024-03-09 ENCOUNTER — Other Ambulatory Visit: Payer: Self-pay | Admitting: Internal Medicine

## 2024-03-09 ENCOUNTER — Encounter: Payer: Self-pay | Admitting: Internal Medicine

## 2024-03-10 DIAGNOSIS — R7989 Other specified abnormal findings of blood chemistry: Secondary | ICD-10-CM | POA: Diagnosis not present

## 2024-03-11 ENCOUNTER — Encounter: Payer: Self-pay | Admitting: Internal Medicine

## 2024-03-11 LAB — BASIC METABOLIC PANEL WITH GFR
BUN/Creatinine Ratio: 8 — ABNORMAL LOW (ref 10–24)
BUN: 11 mg/dL (ref 8–27)
CO2: 20 mmol/L (ref 20–29)
Calcium: 9.5 mg/dL (ref 8.6–10.2)
Chloride: 104 mmol/L (ref 96–106)
Creatinine, Ser: 1.41 mg/dL — ABNORMAL HIGH (ref 0.76–1.27)
Glucose: 80 mg/dL (ref 70–99)
Potassium: 4.3 mmol/L (ref 3.5–5.2)
Sodium: 140 mmol/L (ref 134–144)
eGFR: 56 mL/min/{1.73_m2} — ABNORMAL LOW (ref >59)

## 2024-03-11 NOTE — Addendum Note (Signed)
 Addended byCleola Dach on: 03/11/2024 08:00 AM   Modules accepted: Orders

## 2024-03-14 DIAGNOSIS — G4733 Obstructive sleep apnea (adult) (pediatric): Secondary | ICD-10-CM | POA: Diagnosis not present

## 2024-03-18 ENCOUNTER — Encounter: Attending: Internal Medicine | Admitting: Nutrition

## 2024-03-18 VITALS — Ht 70.0 in | Wt 214.0 lb

## 2024-03-18 DIAGNOSIS — E669 Obesity, unspecified: Secondary | ICD-10-CM | POA: Diagnosis not present

## 2024-03-18 DIAGNOSIS — N1831 Chronic kidney disease, stage 3a: Secondary | ICD-10-CM | POA: Insufficient documentation

## 2024-03-18 NOTE — Progress Notes (Signed)
 Medical Nutrition Therapy  Appointment Start time:  86  Appointment End time:  1508  Primary concerns today: CKD  Referral diagnosis: N18.3 Preferred learning style: Ready  Learning readiness: Ready    NUTRITION ASSESSMENT  63 yr old wmale referred for CKD Stg 3a from Dr. Lydia Sams. Creatine 1.41 mg/dl. eGFR 56. This is a chronic condition. A1C 5.2%. He would like to lose some weight and eat healthier. Concerned about what to eat to improve his kidney function. He is willing to work with Lifestyle Medicine and focus on more whole plant based foods instead of processed foods.  He has been avoiding bananas since they're high in potassium.  Medications:  Current Outpatient Medications on File Prior to Visit  Medication Sig Dispense Refill   buPROPion  (WELLBUTRIN  XL) 300 MG 24 hr tablet TAKE (1) TABLET BY MOUTH ONCE DAILY. 90 tablet 3   fluticasone  (FLONASE ) 50 MCG/ACT nasal spray Place 2 sprays into both nostrils daily. 16 g 6   No current facility-administered medications on file prior to visit.    Labs:  Lab Results  Component Value Date   HGBA1C 5.2 11/28/2023      Latest Ref Rng & Units 03/10/2024    8:12 AM 01/26/2024    8:48 AM 11/28/2023    8:02 AM  CMP  Glucose 70 - 99 mg/dL 80  92  71   BUN 8 - 27 mg/dL 11  15  14    Creatinine 0.76 - 1.27 mg/dL 9.60  4.54  0.98   Sodium 134 - 144 mmol/L 140  143  141   Potassium 3.5 - 5.2 mmol/L 4.3  4.5  4.4   Chloride 96 - 106 mmol/L 104  105  104   CO2 20 - 29 mmol/L 20  22  23    Calcium 8.6 - 10.2 mg/dL 9.5  9.8  9.0   Total Protein 6.0 - 8.5 g/dL   6.5   Total Bilirubin 0.0 - 1.2 mg/dL   0.4   Alkaline Phos 44 - 121 IU/L   65   AST 0 - 40 IU/L   27   ALT 0 - 44 IU/L   29    Lipid Panel     Component Value Date/Time   CHOL 166 11/28/2023 0802   TRIG 88 11/28/2023 0802   HDL 46 11/28/2023 0802   CHOLHDL 3.6 11/28/2023 0802   CHOLHDL 3.8 06/20/2021 0422   VLDL 25 06/20/2021 0422   LDLCALC 104 (H) 11/28/2023 0802    LABVLDL 16 11/28/2023 0802    Notable Signs/Symptoms: none  Lifestyle & Dietary Hx Lives by himself  Estimated daily fluid intake: 40 oz Supplements:  Sleep:  Stress / self-care:  Current average weekly physical activity: ADL  24-Hr Dietary Recall Eats 2-3 meals per day. Eats out often  Estimated Energy Needs Calories: 1800-2000 Carbohydrate: 200g Protein: 135g Fat: 50g   NUTRITION DIAGNOSIS  NB-1.1 Food and nutrition-related knowledge deficit As related to kidney disease.  As evidenced by eGFR 58, Creat 1.4.   NUTRITION INTERVENTION  Nutrition education (E-1) on the following topics:   Renal diet with current recommendations or more whole plant based foods and avoiding 'restrictions' of certain foods or food groups. Focusing on reading food labels, fresh or frozen foods and avoiding canned or processed, refined foods.  Current recommendations do not support restricting foods high in potassium, magnesium or protein unless on a individual basis.  Reviewed [employer] and nutrition education and other health information and food ideas,  recipes and cooking suggestions on website.  Plant based Renal Recommendation handouts    Handouts Provided Include  Plant based renal recommendations  Learning Style & Readiness for Change Teaching method utilized: Visual & Auditory  Demonstrated degree of understanding via: Teach Back  Barriers to learning/adherence to lifestyle change: none  Goals Established by Pt Focus on whole plant based diet with focus on fresh or frozen and avoiding canned or processed foods Drink a gallon of water per day Eat 3 meals per day Don't skip meals Cut out fast and processed foods.    MONITORING & EVALUATION Dietary intake, weekly physical activity, and weight in 1 month.  Next Steps  Patient is to work on eating whole plant based foods and meal planning.Aaron Aas

## 2024-03-23 ENCOUNTER — Other Ambulatory Visit: Payer: Self-pay | Admitting: Internal Medicine

## 2024-03-23 DIAGNOSIS — N529 Male erectile dysfunction, unspecified: Secondary | ICD-10-CM

## 2024-03-24 ENCOUNTER — Encounter: Payer: Self-pay | Admitting: Nutrition

## 2024-03-24 NOTE — Patient Instructions (Signed)
 Goals Established by Pt Focus on whole plant based diet with focus on fresh or frozen and avoiding canned or processed foods Drink a gallon of water per day Eat 3 meals per day Don't skip meals Cut out fast and processed foods.

## 2024-04-14 DIAGNOSIS — G4733 Obstructive sleep apnea (adult) (pediatric): Secondary | ICD-10-CM | POA: Diagnosis not present

## 2024-05-11 ENCOUNTER — Other Ambulatory Visit (HOSPITAL_COMMUNITY): Payer: Self-pay | Admitting: Nephrology

## 2024-05-11 DIAGNOSIS — N1831 Chronic kidney disease, stage 3a: Secondary | ICD-10-CM | POA: Diagnosis not present

## 2024-05-11 DIAGNOSIS — F32A Depression, unspecified: Secondary | ICD-10-CM | POA: Diagnosis not present

## 2024-05-11 DIAGNOSIS — I129 Hypertensive chronic kidney disease with stage 1 through stage 4 chronic kidney disease, or unspecified chronic kidney disease: Secondary | ICD-10-CM | POA: Diagnosis not present

## 2024-05-11 DIAGNOSIS — I5032 Chronic diastolic (congestive) heart failure: Secondary | ICD-10-CM | POA: Diagnosis not present

## 2024-05-13 DIAGNOSIS — G4733 Obstructive sleep apnea (adult) (pediatric): Secondary | ICD-10-CM | POA: Diagnosis not present

## 2024-05-14 DIAGNOSIS — G4733 Obstructive sleep apnea (adult) (pediatric): Secondary | ICD-10-CM | POA: Diagnosis not present

## 2024-05-19 ENCOUNTER — Ambulatory Visit (HOSPITAL_COMMUNITY)
Admission: RE | Admit: 2024-05-19 | Discharge: 2024-05-19 | Disposition: A | Discharge status: Home / Self Care | Source: Ambulatory Visit | Attending: Nephrology | Admitting: Nephrology

## 2024-05-19 DIAGNOSIS — N1831 Chronic kidney disease, stage 3a: Secondary | ICD-10-CM | POA: Diagnosis not present

## 2024-05-19 DIAGNOSIS — I129 Hypertensive chronic kidney disease with stage 1 through stage 4 chronic kidney disease, or unspecified chronic kidney disease: Secondary | ICD-10-CM | POA: Diagnosis not present

## 2024-06-13 DIAGNOSIS — G4733 Obstructive sleep apnea (adult) (pediatric): Secondary | ICD-10-CM | POA: Diagnosis not present

## 2024-06-22 DIAGNOSIS — G4733 Obstructive sleep apnea (adult) (pediatric): Secondary | ICD-10-CM | POA: Diagnosis not present

## 2024-07-09 DIAGNOSIS — N189 Chronic kidney disease, unspecified: Secondary | ICD-10-CM | POA: Diagnosis not present

## 2024-07-09 DIAGNOSIS — N1831 Chronic kidney disease, stage 3a: Secondary | ICD-10-CM | POA: Diagnosis not present

## 2024-07-09 DIAGNOSIS — G4733 Obstructive sleep apnea (adult) (pediatric): Secondary | ICD-10-CM | POA: Diagnosis not present

## 2024-07-12 DIAGNOSIS — D229 Melanocytic nevi, unspecified: Secondary | ICD-10-CM | POA: Diagnosis not present

## 2024-07-12 DIAGNOSIS — L820 Inflamed seborrheic keratosis: Secondary | ICD-10-CM | POA: Diagnosis not present

## 2024-07-12 DIAGNOSIS — L814 Other melanin hyperpigmentation: Secondary | ICD-10-CM | POA: Diagnosis not present

## 2024-07-14 DIAGNOSIS — G4733 Obstructive sleep apnea (adult) (pediatric): Secondary | ICD-10-CM | POA: Diagnosis not present

## 2024-07-16 LAB — LAB REPORT - SCANNED
A1c: 5.2
Calcium: 9.7
EGFR: 53
HM HIV Screening: NEGATIVE
PTH: 29

## 2024-07-29 ENCOUNTER — Ambulatory Visit (INDEPENDENT_AMBULATORY_CARE_PROVIDER_SITE_OTHER): Admitting: Internal Medicine

## 2024-07-29 ENCOUNTER — Encounter: Payer: Self-pay | Admitting: Internal Medicine

## 2024-07-29 VITALS — BP 126/72 | HR 65 | Ht 70.0 in | Wt 207.0 lb

## 2024-07-29 DIAGNOSIS — F339 Major depressive disorder, recurrent, unspecified: Secondary | ICD-10-CM

## 2024-07-29 DIAGNOSIS — N1831 Chronic kidney disease, stage 3a: Secondary | ICD-10-CM | POA: Diagnosis not present

## 2024-07-29 DIAGNOSIS — F411 Generalized anxiety disorder: Secondary | ICD-10-CM | POA: Diagnosis not present

## 2024-07-29 DIAGNOSIS — G4733 Obstructive sleep apnea (adult) (pediatric): Secondary | ICD-10-CM | POA: Diagnosis not present

## 2024-07-29 MED ORDER — BUPROPION HCL ER (XL) 150 MG PO TB24: 150.0000 mg | ORAL_TABLET | Freq: Every day | ORAL | 3 refills | Status: DC

## 2024-07-29 NOTE — Assessment & Plan Note (Addendum)
 Recent CMP reviewed, GFR stays around 55 Followed by nephrology - Dr. Rachele, last visit note reviewed Chronically elevated serum creatinine, had elevated Cystatin C as well He exercises regularly, could be due to higher muscle mass Maintain adequate hydration Avoid nephrotoxic agents

## 2024-07-29 NOTE — Patient Instructions (Signed)
Please continue to take medications as prescribed.  Please continue to follow low salt diet and perform moderate exercise/walking at least 150 mins/week. 

## 2024-07-29 NOTE — Assessment & Plan Note (Signed)
 Flowsheet Row Office Visit from 07/29/2024 in [facility]  PHQ-2 Total Score 0   Well-controlled with Wellbutrin  150 mg QD, reduced dose from 300 mg Has tried Zoloft and Lexapro in the past

## 2024-07-29 NOTE — Assessment & Plan Note (Addendum)
 Well-controlled with Wellbutrin , reduced dose to 150 mg QD after discussion with the patient

## 2024-07-29 NOTE — Progress Notes (Signed)
 Established Patient Office Visit  Subjective:  Patient ID: Stephen [name], male    DOB: [birth year]  Age: 63 y.o. MRN: [id number]  CC:  Chief Complaint  Patient presents with   Care Management    Six month follow up     HPI Stephen [name] is a 63 y.o. male with past medical history of depression, anxiety and erectile dysfunction who presents for f/u of his chronic medical conditions.  He takes Wellbutrin  for depression and anxiety.  He has tried Zoloft and Lexapro in the past.  He received BH therapy in the past as well.  Denies any anhedonia, SI or HI currently.  He takes Cialis  as needed for ED.  Denies any dysuria or hematuria currently.  His S. Creatine has been borderline elevated for many years. He exercises regularly, lifts weights. His cystatin C level was also borderline elevated. GFR was 53, overall stable.  Denies dysuria, hematuria or urinary hesitancy or resistance.    Past Medical History:  Diagnosis Date   Depression    Hypertension     Past Surgical History:  Procedure Laterality Date   KNEE SURGERY      Family History  Problem Relation Age of Onset   Healthy Mother    Healthy Father     Social History   Socioeconomic History   Marital status: Legally Separated    Spouse name: Not on file   Number of children: Not on file   Years of education: Not on file   Highest education level: Bachelor's degree (e.g., BA, AB, BS)  Occupational History   Not on file  Tobacco Use   Smoking status: Never   Smokeless tobacco: Never  Substance and Sexual Activity   Alcohol use: Yes   Drug use: Never   Sexual activity: Not on file  Other Topics Concern   Not on file  Social History Narrative   Not on file   Social Drivers of Health   Financial Resource Strain: Low Risk  (07/28/2024)   Overall Financial Resource Strain (CARDIA)    Difficulty of Paying Living Expenses: Not hard at all  Food Insecurity: No Food Insecurity (07/28/2024)   Hunger  Vital Sign    Worried About Running Out of Food in the Last Year: Never true    Ran Out of Food in the Last Year: Never true  Transportation Needs: No Transportation Needs (07/28/2024)   PRAPARE - Administrator, Civil Service (Medical): No    Lack of Transportation (Non-Medical): No  Physical Activity: Insufficiently Active (07/28/2024)   Exercise Vital Sign    Days of Exercise per Week: 4 days    Minutes of Exercise per Session: 30 min  Stress: No Stress Concern Present (07/28/2024)   [employer] of Occupational Health - Occupational Stress Questionnaire    Feeling of Stress: Only a little  Social Connections: Moderately Integrated (07/28/2024)   Social Connection and Isolation Panel    Frequency of Communication with Friends and Family: Three times a week    Frequency of Social Gatherings with Friends and Family: Once a week    Attends Religious Services: 1 to 4 times per year    Active Member of [employer] or Organizations: No    Attends Engineer, structural: Not on file    Marital Status: Living with partner  Intimate Partner Violence: Not on file    Outpatient Medications Prior to Visit  Medication Sig Dispense Refill   fluticasone  (FLONASE ) 50 MCG/ACT  nasal spray Place 2 sprays into both nostrils daily. 16 g 6   tadalafil  (CIALIS ) 10 MG tablet TAKE 1 TABLET BY MOUTH EVERY OTHER DAY AS NEEDED FOR ERECTILE DYSFUNCTION. 30 tablet 1   buPROPion  (WELLBUTRIN  XL) 300 MG 24 hr tablet TAKE (1) TABLET BY MOUTH ONCE DAILY. 90 tablet 3   No facility-administered medications prior to visit.    No Known Allergies  ROS Review of Systems  Constitutional:  Negative for chills and fever.  HENT:  Negative for congestion and sore throat.   Eyes:  Negative for pain and discharge.  Respiratory:  Negative for cough and shortness of breath.   Cardiovascular:  Negative for chest pain and palpitations.  Gastrointestinal:  Negative for diarrhea, nausea and vomiting.   Endocrine: Negative for polydipsia and polyuria.  Genitourinary:  Negative for dysuria and hematuria.  Musculoskeletal:  Negative for neck pain and neck stiffness.  Skin:  Negative for rash.  Neurological:  Negative for dizziness, weakness, numbness and headaches.  Psychiatric/Behavioral:  Negative for agitation and behavioral problems.       Objective:    Physical Exam Vitals reviewed.  Constitutional:      General: He is not in acute distress.    Appearance: He is not diaphoretic.  HENT:     Head: Normocephalic and atraumatic.     Mouth/Throat:     Mouth: Mucous membranes are moist.  Eyes:     General: No scleral icterus.    Extraocular Movements: Extraocular movements intact.  Cardiovascular:     Rate and Rhythm: Regular rhythm.     Heart sounds: Normal heart sounds. No murmur heard. Pulmonary:     Breath sounds: Normal breath sounds. No wheezing or rales.  Musculoskeletal:     Cervical back: Neck supple. No tenderness.     Left knee: No swelling. Normal range of motion.     Right lower leg: No edema.     Left lower leg: No edema.  Skin:    General: Skin is warm.     Findings: No rash.  Neurological:     General: No focal deficit present.     Mental Status: He is alert and oriented to person, place, and time.  Psychiatric:        Mood and Affect: Mood normal.        Behavior: Behavior normal.     BP 126/72   Pulse 65   Ht 5' 10 (1.778 m)   Wt 207 lb (93.9 kg)   SpO2 98%   BMI 29.70 kg/m  Wt Readings from Last 3 Encounters:  07/29/24 207 lb (93.9 kg)  03/18/24 214 lb (97.1 kg)  01/22/24 210 lb (95.3 kg)    Lab Results  Component Value Date   TSH 1.740 11/28/2023   Lab Results  Component Value Date   WBC 5.7 01/26/2024   HGB 15.0 01/26/2024   HCT 45.0 01/26/2024   MCV 94 01/26/2024   PLT 262 01/26/2024   Lab Results  Component Value Date   NA 140 03/10/2024   K 4.3 03/10/2024   CO2 20 03/10/2024   GLUCOSE 80 03/10/2024   BUN 11  03/10/2024   CREATININE 1.41 (H) 03/10/2024   BILITOT 0.4 11/28/2023   ALKPHOS 65 11/28/2023   AST 27 11/28/2023   ALT 29 11/28/2023   PROT 6.5 11/28/2023   ALBUMIN 4.1 11/28/2023   CALCIUM 9.5 03/10/2024   ANIONGAP 6 06/20/2021   EGFR 56 (L) 03/10/2024   Lab Results  Component Value Date   CHOL 166 11/28/2023   Lab Results  Component Value Date   HDL 46 11/28/2023   Lab Results  Component Value Date   LDLCALC 104 (H) 11/28/2023   Lab Results  Component Value Date   TRIG 88 11/28/2023   Lab Results  Component Value Date   CHOLHDL 3.6 11/28/2023   Lab Results  Component Value Date   HGBA1C 5.2 11/28/2023      Assessment & Plan:   Problem List Items Addressed This Visit       Respiratory   Obstructive sleep apnea   Uses CPAP regularly.        Genitourinary   Stage 3a chronic kidney disease (HCC) - Primary   Recent CMP reviewed, GFR stays around 55 Followed by nephrology - Dr. Rachele, last visit note reviewed Chronically elevated serum creatinine, had elevated Cystatin C as well He exercises regularly, could be due to higher muscle mass Maintain adequate hydration Avoid nephrotoxic agents        Other   Generalized anxiety disorder (Chronic)   Well-controlled with Wellbutrin , reduced dose to 150 mg QD after discussion with the patient      Relevant Medications   buPROPion  (WELLBUTRIN  XL) 150 MG 24 hr tablet   Depression, recurrent   Flowsheet Row Office Visit from 07/29/2024 in [facility]  PHQ-2 Total Score 0   Well-controlled with Wellbutrin  150 mg QD, reduced dose from 300 mg Has tried Zoloft and Lexapro in the past      Relevant Medications   buPROPion  (WELLBUTRIN  XL) 150 MG 24 hr tablet     Meds ordered this encounter  Medications   buPROPion  (WELLBUTRIN  XL) 150 MG 24 hr tablet    Sig: Take 1 tablet (150 mg total) by mouth daily.    Dispense:  90 tablet    Refill:  3    Dose change - 07/29/24     Follow-up: Return in about 6 months (around 01/26/2025) for Annual physical.    Suzzane MARLA Blanch, MD

## 2024-07-29 NOTE — Assessment & Plan Note (Signed)
 Uses CPAP regularly

## 2024-08-04 ENCOUNTER — Encounter: Payer: Self-pay | Admitting: Nutrition

## 2024-08-04 ENCOUNTER — Encounter: Attending: Internal Medicine | Admitting: Nutrition

## 2024-08-04 VITALS — Ht 70.0 in | Wt 209.8 lb

## 2024-08-04 DIAGNOSIS — N1831 Chronic kidney disease, stage 3a: Secondary | ICD-10-CM | POA: Insufficient documentation

## 2024-08-04 NOTE — Patient Instructions (Addendum)
 Work on meal planning and meal prepping Keep the great job Hydrate well to gallon of water per day

## 2024-08-04 NOTE — Progress Notes (Signed)
 Medical Nutrition Therapy  Appointment Start time:  [phone]   Appointment End time:  1332  Primary concerns today: CKD  Referral diagnosis: N18.3 Preferred learning style: Ready  Learning readiness: Ready    NUTRITION ASSESSMENT Follow up Has cut back on red meat, cut down sweets. Drinks 64-120 oz of water per day Staying active. Works out  4 days per week. Eating fresh or frozen veggies. Cooks most meals at home. Eating more plant based foods. Doesn't eat a lot of processed foods. Creatine 1.49, eGFR 53.  CKD stable per PCP and Nephrology. Wt stable.     Wt Readings from Last 3 Encounters:  08/04/24 209 lb 12.8 oz (95.2 kg)  07/29/24 207 lb (93.9 kg)  03/18/24 214 lb (97.1 kg)   Ht Readings from Last 3 Encounters:  08/04/24 5' 10 (1.778 m)  07/29/24 5' 10 (1.778 m)  03/18/24 5' 10 (1.778 m)   Body mass index is 30.1 kg/m. @BMIFA @ Facility age limit for growth %iles is 20 years. Facility age limit for growth %iles is 20 years.  Medications:  Current Outpatient Medications on File Prior to Visit  Medication Sig Dispense Refill   buPROPion  (WELLBUTRIN  XL) 150 MG 24 hr tablet Take 1 tablet (150 mg total) by mouth daily. 90 tablet 3   fluticasone  (FLONASE ) 50 MCG/ACT nasal spray Place 2 sprays into both nostrils daily. 16 g 6   tadalafil  (CIALIS ) 10 MG tablet TAKE 1 TABLET BY MOUTH EVERY OTHER DAY AS NEEDED FOR ERECTILE DYSFUNCTION. 30 tablet 1   No current facility-administered medications on file prior to visit.    Labs:  Lab Results  Component Value Date   HGBA1C 5.2 11/28/2023      Latest Ref Rng & Units 03/10/2024    8:12 AM 01/26/2024    8:48 AM 11/28/2023    8:02 AM  CMP  Glucose 70 - 99 mg/dL 80  92  71   BUN 8 - 27 mg/dL 11  15  14    Creatinine 0.76 - 1.27 mg/dL 8.58  8.48  8.63   Sodium 134 - 144 mmol/L 140  143  141   Potassium 3.5 - 5.2 mmol/L 4.3  4.5  4.4   Chloride 96 - 106 mmol/L 104  105  104   CO2 20 - 29 mmol/L 20  22  23    Calcium 8.6 - 10.2  mg/dL 9.5  9.8  9.0   Total Protein 6.0 - 8.5 g/dL   6.5   Total Bilirubin 0.0 - 1.2 mg/dL   0.4   Alkaline Phos 44 - 121 IU/L   65   AST 0 - 40 IU/L   27   ALT 0 - 44 IU/L   29    Lipid Panel     Component Value Date/Time   CHOL 166 11/28/2023 0802   TRIG 88 11/28/2023 0802   HDL 46 11/28/2023 0802   CHOLHDL 3.6 11/28/2023 0802   CHOLHDL 3.8 06/20/2021 0422   VLDL 25 06/20/2021 0422   LDLCALC 104 (H) 11/28/2023 0802   LABVLDL 16 11/28/2023 0802    Notable Signs/Symptoms: none  Lifestyle & Dietary Hx Lives by himself  Estimated daily fluid intake: 40 oz Supplements:  Sleep:  Stress / self-care:  Current average weekly physical activity: ADL  24-Hr Dietary Recall B) Blueberries/strawberries, pineapple, bagel with cream , coffee and water L) Sandwich on [employer] bread, shaved turkey spinach mustard or mayo, provolone cheese and fruit D) Leftover from lunch.-chicken with bread and  slice bacon, spring mix and cheese,  water [place] fries left over Ice cream sandwich  Estimated Energy Needs Calories: 1800-2000 Carbohydrate: 200g Protein: 135g Fat: 50g   NUTRITION DIAGNOSIS  NB-1.1 Food and nutrition-related knowledge deficit As related to kidney disease.  As evidenced by eGFR 58, Creat 1.4.   NUTRITION INTERVENTION  Nutrition education (E-1) on the following topics:   Renal diet with current recommendations or more whole plant based foods and avoiding 'restrictions' of certain foods or food groups. Focusing on reading food labels, fresh or frozen foods and avoiding canned or processed, refined foods.  Current recommendations do not support restricting foods high in potassium, magnesium or protein unless on a individual basis.  Reviewed [employer] and nutrition education and other health information and food ideas, recipes and cooking suggestions on website.  Plant based Renal Recommendation handouts    Handouts Provided Include  Plant based renal  recommendations  Learning Style & Readiness for Change Teaching method utilized: Visual & Auditory  Demonstrated degree of understanding via: Teach Back  Barriers to learning/adherence to lifestyle change: none  Goals Established by Pt  Work on meal planning and meal prepping Keep the great job Hydrate well to gallon of water per day  MONITORING & EVALUATION Dietary intake, weekly physical activity, and weight in PRN  Next Steps  Patient is to work on eating whole plant based foods and meal planning.SABRA

## 2024-08-10 ENCOUNTER — Other Ambulatory Visit: Payer: Self-pay | Admitting: Internal Medicine

## 2024-08-10 DIAGNOSIS — N529 Male erectile dysfunction, unspecified: Secondary | ICD-10-CM

## 2024-08-30 DIAGNOSIS — I5032 Chronic diastolic (congestive) heart failure: Secondary | ICD-10-CM | POA: Diagnosis not present

## 2024-08-30 DIAGNOSIS — I129 Hypertensive chronic kidney disease with stage 1 through stage 4 chronic kidney disease, or unspecified chronic kidney disease: Secondary | ICD-10-CM | POA: Diagnosis not present

## 2024-08-30 DIAGNOSIS — N182 Chronic kidney disease, stage 2 (mild): Secondary | ICD-10-CM | POA: Diagnosis not present

## 2024-08-30 DIAGNOSIS — G4733 Obstructive sleep apnea (adult) (pediatric): Secondary | ICD-10-CM | POA: Diagnosis not present

## 2024-09-12 ENCOUNTER — Encounter: Payer: Self-pay | Admitting: Internal Medicine

## 2024-09-13 ENCOUNTER — Other Ambulatory Visit: Payer: Self-pay | Admitting: Internal Medicine

## 2024-09-13 DIAGNOSIS — F411 Generalized anxiety disorder: Secondary | ICD-10-CM

## 2024-09-13 DIAGNOSIS — F339 Major depressive disorder, recurrent, unspecified: Secondary | ICD-10-CM

## 2024-09-13 MED ORDER — BUPROPION HCL ER (XL) 300 MG PO TB24: 300.0000 mg | ORAL_TABLET | Freq: Every day | ORAL | 1 refills | Status: AC

## 2024-10-11 DIAGNOSIS — G4733 Obstructive sleep apnea (adult) (pediatric): Secondary | ICD-10-CM | POA: Diagnosis not present

## 2024-11-16 ENCOUNTER — Other Ambulatory Visit: Payer: Self-pay | Admitting: Internal Medicine

## 2024-11-16 DIAGNOSIS — N529 Male erectile dysfunction, unspecified: Secondary | ICD-10-CM

## 2025-01-26 ENCOUNTER — Encounter: Admitting: Internal Medicine
